# Patient Record
Sex: Male | Born: 1970 | Race: White | Hispanic: No | Marital: Single | Smoking: Current every day smoker
Health system: Southern US, Community
[De-identification: ages and names within clinical notes are randomized; demographics above are authoritative.]

## PROBLEM LIST (undated history)

## (undated) DIAGNOSIS — F17200 Nicotine dependence, unspecified, uncomplicated: Secondary | ICD-10-CM

## (undated) DIAGNOSIS — F1010 Alcohol abuse, uncomplicated: Secondary | ICD-10-CM

## (undated) DIAGNOSIS — Z789 Other specified health status: Secondary | ICD-10-CM

## (undated) DIAGNOSIS — I10 Essential (primary) hypertension: Secondary | ICD-10-CM

## (undated) HISTORY — PX: FRACTURE SURGERY: SHX138

---

## 2001-08-27 ENCOUNTER — Emergency Department (HOSPITAL_COMMUNITY): Admission: EM | Admit: 2001-08-27 | Discharge: 2001-08-27 | Payer: Self-pay | Admitting: Emergency Medicine

## 2001-08-27 ENCOUNTER — Encounter: Payer: Self-pay | Admitting: Emergency Medicine

## 2001-12-01 ENCOUNTER — Emergency Department (HOSPITAL_COMMUNITY): Admission: EM | Admit: 2001-12-01 | Discharge: 2001-12-01 | Payer: Self-pay | Admitting: Emergency Medicine

## 2003-08-06 ENCOUNTER — Emergency Department (HOSPITAL_COMMUNITY): Admission: AD | Admit: 2003-08-06 | Discharge: 2003-08-06 | Payer: Self-pay | Admitting: Emergency Medicine

## 2005-03-22 ENCOUNTER — Emergency Department (HOSPITAL_COMMUNITY): Admission: EM | Admit: 2005-03-22 | Discharge: 2005-03-22 | Payer: Self-pay | Admitting: Emergency Medicine

## 2005-03-28 ENCOUNTER — Ambulatory Visit (HOSPITAL_COMMUNITY): Admission: RE | Admit: 2005-03-28 | Discharge: 2005-03-29 | Payer: Self-pay | Admitting: Orthopedic Surgery

## 2006-11-19 ENCOUNTER — Emergency Department (HOSPITAL_COMMUNITY): Admission: EM | Admit: 2006-11-19 | Discharge: 2006-11-20 | Payer: Self-pay | Admitting: Emergency Medicine

## 2006-11-21 ENCOUNTER — Ambulatory Visit (HOSPITAL_COMMUNITY): Admission: RE | Admit: 2006-11-21 | Discharge: 2006-11-21 | Payer: Self-pay | Admitting: Orthopaedic Surgery

## 2007-02-06 ENCOUNTER — Emergency Department (HOSPITAL_COMMUNITY): Admission: EM | Admit: 2007-02-06 | Discharge: 2007-02-06 | Payer: Self-pay | Admitting: Emergency Medicine

## 2013-03-29 ENCOUNTER — Emergency Department (HOSPITAL_COMMUNITY): Payer: Self-pay

## 2013-03-29 ENCOUNTER — Emergency Department (HOSPITAL_COMMUNITY)
Admission: EM | Admit: 2013-03-29 | Discharge: 2013-03-29 | Disposition: A | Discharge status: Home / Self Care | Payer: Self-pay | Attending: Emergency Medicine | Admitting: Emergency Medicine

## 2013-03-29 ENCOUNTER — Encounter (HOSPITAL_COMMUNITY): Payer: Self-pay | Admitting: Emergency Medicine

## 2013-03-29 DIAGNOSIS — Y9389 Activity, other specified: Secondary | ICD-10-CM | POA: Insufficient documentation

## 2013-03-29 DIAGNOSIS — N6009 Solitary cyst of unspecified breast: Secondary | ICD-10-CM | POA: Insufficient documentation

## 2013-03-29 DIAGNOSIS — L539 Erythematous condition, unspecified: Secondary | ICD-10-CM | POA: Insufficient documentation

## 2013-03-29 DIAGNOSIS — N6001 Solitary cyst of right breast: Secondary | ICD-10-CM

## 2013-03-29 DIAGNOSIS — F17200 Nicotine dependence, unspecified, uncomplicated: Secondary | ICD-10-CM | POA: Insufficient documentation

## 2013-03-29 DIAGNOSIS — M7021 Olecranon bursitis, right elbow: Secondary | ICD-10-CM

## 2013-03-29 DIAGNOSIS — M7020 Olecranon bursitis, unspecified elbow: Secondary | ICD-10-CM | POA: Insufficient documentation

## 2013-03-29 DIAGNOSIS — R21 Rash and other nonspecific skin eruption: Secondary | ICD-10-CM | POA: Insufficient documentation

## 2013-03-29 MED ORDER — TRAMADOL HCL 50 MG PO TABS
50.0000 mg | ORAL_TABLET | Freq: Once | ORAL | Status: AC
  Administered 2013-03-29: 50 mg via ORAL
  Filled 2013-03-29: qty 1

## 2013-03-29 MED ORDER — IBUPROFEN 800 MG PO TABS: 800.0000 mg | ORAL_TABLET | Freq: Three times a day (TID) | ORAL | Status: DC

## 2013-03-29 MED ORDER — SULFAMETHOXAZOLE-TRIMETHOPRIM 800-160 MG PO TABS: 1.0000 | ORAL_TABLET | Freq: Two times a day (BID) | ORAL | Status: DC
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
## 2013-03-31 NOTE — ED Provider Notes (Signed)
Medical screening examination/treatment/procedure(s) were performed by non-physician practitioner and as supervising physician I was immediately available for consultation/collaboration.  Jaymari Cromie R. Lovella Hardie, MD 03/31/13 0016 

## 2013-04-07 ENCOUNTER — Ambulatory Visit (INDEPENDENT_AMBULATORY_CARE_PROVIDER_SITE_OTHER): Payer: Self-pay | Admitting: General Surgery

## 2013-04-15 ENCOUNTER — Encounter (INDEPENDENT_AMBULATORY_CARE_PROVIDER_SITE_OTHER): Payer: Self-pay | Admitting: General Surgery

## 2013-04-16 ENCOUNTER — Emergency Department (HOSPITAL_COMMUNITY)
Admission: EM | Admit: 2013-04-16 | Discharge: 2013-04-16 | Disposition: A | Discharge status: Home / Self Care | Payer: Self-pay | Attending: Emergency Medicine | Admitting: Emergency Medicine

## 2013-04-16 ENCOUNTER — Encounter (HOSPITAL_COMMUNITY): Payer: Self-pay | Admitting: Emergency Medicine

## 2013-04-16 ENCOUNTER — Emergency Department (HOSPITAL_COMMUNITY): Payer: Self-pay

## 2013-04-16 DIAGNOSIS — IMO0002 Reserved for concepts with insufficient information to code with codable children: Secondary | ICD-10-CM | POA: Insufficient documentation

## 2013-04-16 DIAGNOSIS — Y9389 Activity, other specified: Secondary | ICD-10-CM | POA: Insufficient documentation

## 2013-04-16 DIAGNOSIS — T07XXXA Unspecified multiple injuries, initial encounter: Secondary | ICD-10-CM

## 2013-04-16 DIAGNOSIS — S42023A Displaced fracture of shaft of unspecified clavicle, initial encounter for closed fracture: Secondary | ICD-10-CM | POA: Insufficient documentation

## 2013-04-16 DIAGNOSIS — F17200 Nicotine dependence, unspecified, uncomplicated: Secondary | ICD-10-CM | POA: Insufficient documentation

## 2013-04-16 DIAGNOSIS — Y92410 Unspecified street and highway as the place of occurrence of the external cause: Secondary | ICD-10-CM | POA: Insufficient documentation

## 2013-04-16 DIAGNOSIS — S42001A Fracture of unspecified part of right clavicle, initial encounter for closed fracture: Secondary | ICD-10-CM

## 2013-04-16 MED ORDER — KETOROLAC TROMETHAMINE 30 MG/ML IJ SOLN
30.0000 mg | Freq: Once | INTRAMUSCULAR | Status: AC
  Administered 2013-04-16: 30 mg via INTRAVENOUS
  Filled 2013-04-16: qty 1

## 2013-04-16 MED ORDER — HYDROMORPHONE HCL PF 2 MG/ML IJ SOLN
2.0000 mg | Freq: Once | INTRAMUSCULAR | Status: AC
  Administered 2013-04-16: 2 mg via INTRAVENOUS
  Filled 2013-04-16: qty 1

## 2013-04-16 MED ORDER — HYDROMORPHONE HCL PF 1 MG/ML IJ SOLN
1.0000 mg | Freq: Once | INTRAMUSCULAR | Status: AC
  Administered 2013-04-16: 1 mg via INTRAVENOUS
  Filled 2013-04-16: qty 1

## 2013-04-16 MED ORDER — OXYCODONE-ACETAMINOPHEN 5-325 MG PO TABS: 1.0000 | ORAL_TABLET | ORAL | Status: DC | PRN
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
## 2013-04-16 NOTE — ED Notes (Signed)
Placed bacitracin on wounds and cover with gauze.

## 2013-04-16 NOTE — ED Notes (Signed)
Pt returned from radiology.
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
## 2013-04-16 NOTE — ED Notes (Signed)
Paged ortho 
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
## 2013-04-28 ENCOUNTER — Encounter (HOSPITAL_COMMUNITY): Payer: Self-pay | Admitting: *Deleted

## 2013-04-28 ENCOUNTER — Encounter (HOSPITAL_COMMUNITY): Payer: Self-pay | Admitting: Pharmacy Technician

## 2013-04-28 NOTE — Interval H&P Note (Signed)
History and Physical Interval Note:  04/28/2013 10:50 PM  Timothy Estrada  has presents for surgery, with the diagnosis of RIGHT CLAVICLE FRACTURE  The various methods of treatment have been discussed with the patient and family. After consideration of risks, benefits and other options for treatment, the patient has consented to  Procedure(s): OPEN REDUCTION INTERNAL FIXATION (ORIF) CLAVICULAR FRACTURE (Right) as a surgical intervention .  The patient's history has been reviewed, patient examined, no change in status, stable for surgery.  I have reviewed the patient's chart and labs.  Questions were answered to the patient's satisfaction.    No changes from previous consult  Pt with profound shortening of R clavicle. Pt want to proceed with ORIF of R clavicle  Skin over R clavicle is stable, mobile + deformity    Mearl Latin, PA-C [facility] [phone] (P) 04/28/2013 10:52 PM
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
## 2013-04-29 ENCOUNTER — Ambulatory Visit (HOSPITAL_COMMUNITY): Payer: Self-pay

## 2013-04-29 ENCOUNTER — Encounter (HOSPITAL_COMMUNITY)
Admission: RE | Disposition: A | Discharge status: Home / Self Care | Payer: Self-pay | Source: Ambulatory Visit | Attending: Orthopedic Surgery

## 2013-04-29 ENCOUNTER — Encounter (HOSPITAL_COMMUNITY): Payer: Self-pay | Admitting: *Deleted

## 2013-04-29 ENCOUNTER — Ambulatory Visit (HOSPITAL_COMMUNITY): Payer: Self-pay | Admitting: Anesthesiology

## 2013-04-29 ENCOUNTER — Encounter (HOSPITAL_COMMUNITY): Payer: Self-pay | Admitting: Anesthesiology

## 2013-04-29 ENCOUNTER — Ambulatory Visit (HOSPITAL_COMMUNITY)
Admission: RE | Admit: 2013-04-29 | Discharge: 2013-04-29 | Disposition: A | Discharge status: Home / Self Care | Payer: MEDICAID | Source: Ambulatory Visit | Attending: Orthopedic Surgery | Admitting: Orthopedic Surgery

## 2013-04-29 DIAGNOSIS — L723 Sebaceous cyst: Secondary | ICD-10-CM | POA: Insufficient documentation

## 2013-04-29 DIAGNOSIS — Y92410 Unspecified street and highway as the place of occurrence of the external cause: Secondary | ICD-10-CM | POA: Insufficient documentation

## 2013-04-29 DIAGNOSIS — F17200 Nicotine dependence, unspecified, uncomplicated: Secondary | ICD-10-CM | POA: Insufficient documentation

## 2013-04-29 DIAGNOSIS — S42001A Fracture of unspecified part of right clavicle, initial encounter for closed fracture: Secondary | ICD-10-CM

## 2013-04-29 DIAGNOSIS — S42023A Displaced fracture of shaft of unspecified clavicle, initial encounter for closed fracture: Secondary | ICD-10-CM | POA: Insufficient documentation

## 2013-04-29 HISTORY — PX: ORIF CLAVICULAR FRACTURE: SHX5055

## 2013-04-29 HISTORY — DX: Other specified health status: Z78.9

## 2013-04-29 LAB — SURGICAL PCR SCREEN
MRSA, PCR: NEGATIVE
Staphylococcus aureus: NEGATIVE

## 2013-04-29 LAB — CBC
Hemoglobin: 13.9 g/dL (ref 13.0–17.0)
MCHC: 33.9 g/dL (ref 30.0–36.0)
Platelets: 257 10*3/uL (ref 150–400)

## 2013-04-29 LAB — GRAM STAIN

## 2013-04-29 SURGERY — OPEN REDUCTION INTERNAL FIXATION (ORIF) CLAVICULAR FRACTURE
Anesthesia: General | Laterality: Right | Wound class: Clean

## 2013-04-29 MED ORDER — OXYCODONE HCL 5 MG PO TABS: 5.0000 mg | ORAL_TABLET | ORAL | Status: DC | PRN

## 2013-04-29 MED ORDER — METHOCARBAMOL 500 MG PO TABS: 500.0000 mg | ORAL_TABLET | Freq: Four times a day (QID) | ORAL | Status: DC | PRN

## 2013-04-29 MED ORDER — MIDAZOLAM HCL 5 MG/5ML IJ SOLN
INTRAMUSCULAR | Status: DC | PRN
  Administered 2013-04-29: 2 mg via INTRAVENOUS

## 2013-04-29 MED ORDER — OXYCODONE HCL 5 MG PO TABS: 5.0000 mg | ORAL_TABLET | Freq: Once | ORAL | Status: DC | PRN

## 2013-04-29 MED ORDER — ROCURONIUM BROMIDE 100 MG/10ML IV SOLN
INTRAVENOUS | Status: DC | PRN
  Administered 2013-04-29: 50 mg via INTRAVENOUS
  Administered 2013-04-29: 20 mg via INTRAVENOUS

## 2013-04-29 MED ORDER — NEOSTIGMINE METHYLSULFATE 1 MG/ML IJ SOLN
INTRAMUSCULAR | Status: DC | PRN
  Administered 2013-04-29: 3 mg via INTRAVENOUS

## 2013-04-29 MED ORDER — HYDRALAZINE HCL 20 MG/ML IJ SOLN
INTRAMUSCULAR | Status: DC | PRN
  Administered 2013-04-29: 5 mg via INTRAVENOUS

## 2013-04-29 MED ORDER — MUPIROCIN 2 % EX OINT
TOPICAL_OINTMENT | CUTANEOUS | Status: AC
  Filled 2013-04-29: qty 22

## 2013-04-29 MED ORDER — OXYCODONE HCL 5 MG PO TABS
10.0000 mg | ORAL_TABLET | Freq: Once | ORAL | Status: AC
  Administered 2013-04-29: 10 mg via ORAL

## 2013-04-29 MED ORDER — HYDROMORPHONE HCL PF 1 MG/ML IJ SOLN
0.2500 mg | INTRAMUSCULAR | Status: DC | PRN
  Administered 2013-04-29 (×6): 0.5 mg via INTRAVENOUS

## 2013-04-29 MED ORDER — OXYCODONE-ACETAMINOPHEN 5-325 MG PO TABS
2.0000 | ORAL_TABLET | Freq: Once | ORAL | Status: AC
  Administered 2013-04-29: 2 via ORAL

## 2013-04-29 MED ORDER — HYDROMORPHONE HCL PF 1 MG/ML IJ SOLN
INTRAMUSCULAR | Status: AC
  Filled 2013-04-29: qty 2

## 2013-04-29 MED ORDER — CEFAZOLIN SODIUM-DEXTROSE 2-3 GM-% IV SOLR
INTRAVENOUS | Status: AC
  Administered 2013-04-29: 2 g via INTRAVENOUS
  Filled 2013-04-29: qty 50

## 2013-04-29 MED ORDER — OXYCODONE-ACETAMINOPHEN 5-325 MG PO TABS
ORAL_TABLET | ORAL | Status: AC
  Filled 2013-04-29: qty 2

## 2013-04-29 MED ORDER — OXYCODONE HCL 5 MG/5ML PO SOLN: 5.0000 mg | Freq: Once | ORAL | Status: DC | PRN

## 2013-04-29 MED ORDER — BUPIVACAINE-EPINEPHRINE PF 0.5-1:200000 % IJ SOLN
INTRAMUSCULAR | Status: AC
  Filled 2013-04-29: qty 30

## 2013-04-29 MED ORDER — GLYCOPYRROLATE 0.2 MG/ML IJ SOLN
INTRAMUSCULAR | Status: DC | PRN
  Administered 2013-04-29: 0.4 mg via INTRAVENOUS

## 2013-04-29 MED ORDER — CEFAZOLIN SODIUM-DEXTROSE 2-3 GM-% IV SOLR: 2.0000 g | INTRAVENOUS | Status: DC

## 2013-04-29 MED ORDER — OXYCODONE HCL 5 MG PO TABS
ORAL_TABLET | ORAL | Status: AC
  Filled 2013-04-29: qty 2

## 2013-04-29 MED ORDER — LACTATED RINGERS IV SOLN: INTRAVENOUS | Status: DC

## 2013-04-29 MED ORDER — LABETALOL HCL 5 MG/ML IV SOLN
INTRAVENOUS | Status: DC | PRN
  Administered 2013-04-29 (×3): 5 mg via INTRAVENOUS

## 2013-04-29 MED ORDER — BUPIVACAINE-EPINEPHRINE PF 0.5-1:200000 % IJ SOLN
INTRAMUSCULAR | Status: DC | PRN
  Administered 2013-04-29: 30 mL

## 2013-04-29 MED ORDER — HYDROMORPHONE HCL PF 1 MG/ML IJ SOLN
INTRAMUSCULAR | Status: AC
  Filled 2013-04-29: qty 1

## 2013-04-29 MED ORDER — LIDOCAINE HCL (CARDIAC) 20 MG/ML IV SOLN
INTRAVENOUS | Status: DC | PRN
  Administered 2013-04-29: 70 mg via INTRAVENOUS

## 2013-04-29 MED ORDER — ONDANSETRON HCL 4 MG/2ML IJ SOLN: 4.0000 mg | Freq: Four times a day (QID) | INTRAMUSCULAR | Status: DC | PRN

## 2013-04-29 MED ORDER — 0.9 % SODIUM CHLORIDE (POUR BTL) OPTIME
TOPICAL | Status: DC | PRN
  Administered 2013-04-29: 1000 mL

## 2013-04-29 MED ORDER — HYDROMORPHONE HCL PF 1 MG/ML IJ SOLN
INTRAMUSCULAR | Status: DC | PRN
  Administered 2013-04-29: 1 mg via INTRAVENOUS

## 2013-04-29 MED ORDER — MUPIROCIN 2 % EX OINT
TOPICAL_OINTMENT | Freq: Once | CUTANEOUS | Status: AC
  Administered 2013-04-29: 07:00:00 via NASAL
  Filled 2013-04-29: qty 22

## 2013-04-29 MED ORDER — METHOCARBAMOL 500 MG PO TABS
ORAL_TABLET | ORAL | Status: AC
  Filled 2013-04-29: qty 2

## 2013-04-29 MED ORDER — OXYCODONE-ACETAMINOPHEN 5-325 MG PO TABS: 1.0000 | ORAL_TABLET | Freq: Four times a day (QID) | ORAL | Status: DC | PRN

## 2013-04-29 MED ORDER — AMOXICILLIN-POT CLAVULANATE 875-125 MG PO TABS: 1.0000 | ORAL_TABLET | Freq: Two times a day (BID) | ORAL | Status: DC

## 2013-04-29 MED ORDER — ONDANSETRON HCL 4 MG/2ML IJ SOLN
INTRAMUSCULAR | Status: DC | PRN
  Administered 2013-04-29: 4 mg via INTRAVENOUS

## 2013-04-29 MED ORDER — LACTATED RINGERS IV SOLN
INTRAVENOUS | Status: DC | PRN
  Administered 2013-04-29 (×2): via INTRAVENOUS

## 2013-04-29 MED ORDER — METHOCARBAMOL 500 MG PO TABS
1000.0000 mg | ORAL_TABLET | Freq: Once | ORAL | Status: AC
  Administered 2013-04-29: 1000 mg via ORAL

## 2013-04-29 MED ORDER — PROPOFOL 10 MG/ML IV BOLUS
INTRAVENOUS | Status: DC | PRN
  Administered 2013-04-29 (×2): 200 mg via INTRAVENOUS

## 2013-04-29 MED ORDER — ONDANSETRON HCL 4 MG PO TABS: 4.0000 mg | ORAL_TABLET | Freq: Three times a day (TID) | ORAL | Status: DC | PRN

## 2013-04-29 MED ORDER — FENTANYL CITRATE 0.05 MG/ML IJ SOLN
INTRAMUSCULAR | Status: DC | PRN
  Administered 2013-04-29 (×3): 100 ug via INTRAVENOUS
  Administered 2013-04-29: 50 ug via INTRAVENOUS
  Administered 2013-04-29 (×2): 100 ug via INTRAVENOUS
  Administered 2013-04-29: 150 ug via INTRAVENOUS
  Administered 2013-04-29: 50 ug via INTRAVENOUS

## 2013-04-29 SURGICAL SUPPLY — 52 items
BRUSH SCRUB DISP (MISCELLANEOUS) ×4 IMPLANT
CLOTH BEACON ORANGE TIMEOUT ST (SAFETY) ×2 IMPLANT
COVER SURGICAL LIGHT HANDLE (MISCELLANEOUS) ×4 IMPLANT
DRAPE C-ARM 42X72 X-RAY (DRAPES) ×2 IMPLANT
DRAPE C-ARMOR (DRAPES) IMPLANT
DRAPE INCISE IOBAN 66X45 STRL (DRAPES) IMPLANT
DRAPE LAPAROTOMY TRNSV 102X78 (DRAPE) ×2 IMPLANT
DRAPE ORTHO SPLIT 77X108 STRL (DRAPES) ×1
DRAPE SURG ORHT 6 SPLT 77X108 (DRAPES) ×1 IMPLANT
DRAPE U-SHAPE 47X51 STRL (DRAPES) ×2 IMPLANT
DRSG EMULSION OIL 3X3 NADH (GAUZE/BANDAGES/DRESSINGS) ×2 IMPLANT
DRSG MEPILEX BORDER 4X12 (GAUZE/BANDAGES/DRESSINGS) ×2 IMPLANT
ELECT NEEDLE TIP 2.8 STRL (NEEDLE) IMPLANT
ELECT REM PT RETURN 9FT ADLT (ELECTROSURGICAL) ×2
ELECTRODE REM PT RTRN 9FT ADLT (ELECTROSURGICAL) ×1 IMPLANT
GLOVE BIO SURGEON STRL SZ7.5 (GLOVE) ×2 IMPLANT
GLOVE BIO SURGEON STRL SZ8 (GLOVE) ×4 IMPLANT
GLOVE BIOGEL PI IND STRL 7.5 (GLOVE) ×1 IMPLANT
GLOVE BIOGEL PI IND STRL 8 (GLOVE) ×1 IMPLANT
GLOVE BIOGEL PI INDICATOR 7.5 (GLOVE) ×1
GLOVE BIOGEL PI INDICATOR 8 (GLOVE) ×1
GOWN PREVENTION PLUS XLARGE (GOWN DISPOSABLE) ×2 IMPLANT
GOWN STRL NON-REIN LRG LVL3 (GOWN DISPOSABLE) ×4 IMPLANT
KIT BASIN OR (CUSTOM PROCEDURE TRAY) ×2 IMPLANT
KIT ROOM TURNOVER OR (KITS) ×2 IMPLANT
MANIFOLD NEPTUNE II (INSTRUMENTS) IMPLANT
NEEDLE HYPO 25GX1X1/2 BEV (NEEDLE) ×2 IMPLANT
NS IRRIG 1000ML POUR BTL (IV SOLUTION) ×2 IMPLANT
PACK TOTAL JOINT (CUSTOM PROCEDURE TRAY) ×2 IMPLANT
PAD ARMBOARD 7.5X6 YLW CONV (MISCELLANEOUS) ×4 IMPLANT
PLATE SUP MDST CLAV R 8H 100MM (Plate) ×2 IMPLANT
SCREW NONLOCK 3.5X12MM TI LNG (Screw) ×4 IMPLANT
SCREW NONLOCK 3.5X14MM TI LNG (Screw) ×4 IMPLANT
SCREW NONLOCK 3.5X16MM TI LNG (Screw) ×2 IMPLANT
SLING ARM FOAM STRAP LRG (SOFTGOODS) ×2 IMPLANT
SPONGE GAUZE 4X4 12PLY (GAUZE/BANDAGES/DRESSINGS) ×2 IMPLANT
SPONGE LAP 18X18 X RAY DECT (DISPOSABLE) IMPLANT
STRIP CLOSURE SKIN 1/2X4 (GAUZE/BANDAGES/DRESSINGS) ×2 IMPLANT
SUCTION FRAZIER TIP 10 FR DISP (SUCTIONS) ×2 IMPLANT
SUT ETHILON 3 0 PS 1 (SUTURE) ×2 IMPLANT
SUT MNCRL AB 4-0 PS2 18 (SUTURE) ×2 IMPLANT
SUT PROLENE 3 0 PS 1 (SUTURE) ×2 IMPLANT
SUT VIC AB 0 CT1 27 (SUTURE) ×1
SUT VIC AB 0 CT1 27XBRD ANBCTR (SUTURE) ×1 IMPLANT
SUT VIC AB 1 CT1 27 (SUTURE) ×1
SUT VIC AB 1 CT1 27XBRD ANBCTR (SUTURE) ×1 IMPLANT
SUT VIC AB 2-0 CT1 27 (SUTURE) ×1
SUT VIC AB 2-0 CT1 TAPERPNT 27 (SUTURE) ×1 IMPLANT
SUT VIC AB 2-0 CT3 27 (SUTURE) IMPLANT
SYR CONTROL 10ML LL (SYRINGE) IMPLANT
WATER STERILE IRR 1000ML POUR (IV SOLUTION) ×2 IMPLANT
YANKAUER SUCT BULB TIP NO VENT (SUCTIONS) ×2 IMPLANT

## 2013-04-29 NOTE — Anesthesia Preprocedure Evaluation (Addendum)
Anesthesia Evaluation  Patient identified by MRN, date of birth, ID band Patient awake    Reviewed: Allergy & Precautions, H&P , NPO status , Patient's Chart, lab work & pertinent test results  History of Anesthesia Complications Negative for: history of anesthetic complications  Airway Mallampati: II TM Distance: >3 FB Neck ROM: full    Dental  (+) Teeth Intact and Dental Advisory Given   Pulmonary Current Smoker,          Cardiovascular negative cardio ROS      Neuro/Psych negative neurological ROS  negative psych ROS   GI/Hepatic negative GI ROS, Neg liver ROS,   Endo/Other  negative endocrine ROS  Renal/GU negative Renal ROS  negative genitourinary   Musculoskeletal negative musculoskeletal ROS (+)   Abdominal   Peds  Hematology negative hematology ROS (+)   Anesthesia Other Findings   Reproductive/Obstetrics negative OB ROS                         Anesthesia Physical Anesthesia Plan  ASA: II  Anesthesia Plan: General   Post-op Pain Management:    Induction: Intravenous  Airway Management Planned: Oral ETT  Additional Equipment:   Intra-op Plan:   Post-operative Plan: Extubation in OR  Informed Consent: I have reviewed the patients History and Physical, chart, labs and discussed the procedure including the risks, benefits and alternatives for the proposed anesthesia with the patient or authorized representative who has indicated his/her understanding and acceptance.     Plan Discussed with: CRNA, Anesthesiologist and Surgeon  Anesthesia Plan Comments:         Anesthesia Quick Evaluation

## 2013-04-29 NOTE — Preoperative (Signed)
Beta Blockers   Reason not to administer Beta Blockers:Not Applicable 

## 2013-04-29 NOTE — Transfer of Care (Signed)
Immediate Anesthesia Transfer of Care Note  Patient: Timothy Estrada  Procedure(s) Performed: Procedure(s) with comments: OPEN REDUCTION INTERNAL FIXATION (ORIF) CLAVICULAR FRACTURE (Right) - Aspiration of right breast mass,? sebaceaous cyst  Patient Location: PACU  Anesthesia Type:General  Level of Consciousness: awake, alert  and oriented  Airway & Oxygen Therapy: Patient Spontanous Breathing and Patient connected to nasal cannula oxygen  Post-op Assessment: Report given to PACU RN and Post -op Vital signs reviewed and stable  Post vital signs: Reviewed and stable  Complications: No apparent anesthesia complications

## 2013-04-29 NOTE — Anesthesia Postprocedure Evaluation (Signed)
Anesthesia Post Note  Patient: Timothy Estrada  Procedure(s) Performed: Procedure(s) (LRB): OPEN REDUCTION INTERNAL FIXATION (ORIF) CLAVICULAR FRACTURE (Right)  Anesthesia type: General  Patient location: PACU  Post pain: Pain level controlled and Adequate analgesia  Post assessment: Post-op Vital signs reviewed, Patient's Cardiovascular Status Stable, Respiratory Function Stable, Patent Airway and Pain level controlled  Last Vitals:  Filed Vitals:   04/29/13 1100  BP:   Pulse:   Temp:   Resp: 14    Post vital signs: Reviewed and stable  Level of consciousness: awake, alert  and oriented  Complications: No apparent anesthesia complications

## 2013-04-29 NOTE — Brief Op Note (Signed)
04/29/2013  11:14 AM  PATIENT:  Timothy Estrada  42 y.o. male  PRE-OPERATIVE DIAGNOSIS:  RIGHT CLAVICLE FRACTURE  POST-OPERATIVE DIAGNOSIS:  RIGHT CLAVICLE FRACTURE  PROCEDURE:  Procedure(s) with comments: OPEN REDUCTION INTERNAL FIXATION (ORIF) CLAVICULAR FRACTURE (Right) - Aspiration of right breast mass,? sebaceaous cyst  SURGEON:  Surgeon(s) and Role:    * Budd Palmer, MD - Primary  ANESTHESIA:   general  EBL:  Total I/O In: 1300 [I.V.:1300] Out: 100 [Blood:100]  BLOOD ADMINISTERED:none  DRAINS: none   LOCAL MEDICATIONS USED:  MARCAINE     SPECIMEN:  Source of Specimen:  sebaceous cyst right breast  DISPOSITION OF SPECIMEN:  micro; gram positive cocci  COUNTS:  YES  TOURNIQUET:  * No tourniquets in log *  DICTATION: .Other Dictation: Dictation Number [id number]  PLAN OF CARE: Discharge to home after PACU  PATIENT DISPOSITION:  PACU - hemodynamically stable.   Delay start of Pharmacological VTE agent (>24hrs) due to surgical blood loss or risk of bleeding: no

## 2013-04-29 NOTE — OR Nursing (Signed)
May give additional dilaudid per anesthesia

## 2013-04-29 NOTE — Progress Notes (Signed)
I have seen and examined the patient. I agree with the findings above and Mr. Ollen Gross note from 04/28/13. Right clavicle fracture, severely displaced for ORIF.  I discussed with the patient the risks and benefits of surgery, including the possibility of infection, nerve injury, vessel injury, wound breakdown, arthritis, symptomatic hardware, DVT/ PE, loss of motion, and need for further surgery among others.  He understood these risks and wished to proceed.   Budd Palmer, MD 04/29/2013 7:50 AM

## 2013-04-30 ENCOUNTER — Encounter (HOSPITAL_COMMUNITY): Payer: Self-pay | Admitting: Orthopedic Surgery
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
## 2013-05-03 LAB — CULTURE, ROUTINE-ABSCESS

## 2013-05-04 LAB — ANAEROBIC CULTURE: Gram Stain: NONE SEEN

## 2013-06-02 ENCOUNTER — Ambulatory Visit (INDEPENDENT_AMBULATORY_CARE_PROVIDER_SITE_OTHER): Payer: Self-pay | Admitting: Surgery

## 2013-06-28 ENCOUNTER — Telehealth (INDEPENDENT_AMBULATORY_CARE_PROVIDER_SITE_OTHER): Payer: Self-pay | Admitting: Surgery

## 2013-06-28 ENCOUNTER — Encounter (INDEPENDENT_AMBULATORY_CARE_PROVIDER_SITE_OTHER): Payer: Self-pay | Admitting: Surgery

## 2013-06-28 ENCOUNTER — Ambulatory Visit (INDEPENDENT_AMBULATORY_CARE_PROVIDER_SITE_OTHER): Payer: Self-pay | Admitting: Surgery

## 2013-06-28 VITALS — BP 120/78 | HR 62 | Temp 98.0°F | Resp 14 | Ht 69.0 in | Wt 159.8 lb

## 2013-06-28 DIAGNOSIS — R222 Localized swelling, mass and lump, trunk: Secondary | ICD-10-CM

## 2013-06-28 NOTE — Telephone Encounter (Signed)
Patient met with surgery scheduling went over financial responsibilities, patient will call back to schedule. °

## 2013-06-28 NOTE — Progress Notes (Signed)
Patient ID: Timothy Estrada, male   DOB: March 12, 1971, 42 y.o.   MRN: [id number]  Chief Complaint  Patient presents with  . New Evaluation    eval chest cyst    HPI Timothy Estrada is a 42 y.o. male.   HPI This is a pleasant gentleman who I actually saw in consultation in the operating room when he was having a clavicle fracture repaired. He was found to have a large mass or cyst of the right chest wall/breast. He is now interested in having this removed. It occasionally drains. It is causing increased discomfort. He has never noticed erythema or drainage from the nipple. He is otherwise without complaints Past Medical History  Diagnosis Date  . Medical history non-contributory     Past Surgical History  Procedure Laterality Date  . Fracture surgery      Hx of right ankle  . Orif clavicular fracture Right 04/29/2013    Procedure: OPEN REDUCTION INTERNAL FIXATION (ORIF) CLAVICULAR FRACTURE;  Surgeon: Budd Palmer, MD;  Location: [facility];  Service: Orthopedics;  Laterality: Right;  Aspiration of right breast mass,? sebaceaous cyst    History reviewed. No pertinent family history.  Social History History  Substance Use Topics  . Smoking status: Current Every Day Smoker -- 0.50 packs/day  . Smokeless tobacco: Not on file  . Alcohol Use: Yes     Comment: occasional    No Known Allergies  No current outpatient prescriptions on file.   No current facility-administered medications for this visit.    Review of Systems Review of Systems  Constitutional: Negative for fever, chills and unexpected weight change.  HENT: Negative for hearing loss, congestion, sore throat, trouble swallowing and voice change.   Eyes: Negative for visual disturbance.  Respiratory: Negative for cough and wheezing.   Cardiovascular: Negative for chest pain, palpitations and leg swelling.  Gastrointestinal: Negative for nausea, vomiting, abdominal pain, diarrhea, constipation, blood in stool, abdominal  distention, anal bleeding and rectal pain.  Genitourinary: Negative for hematuria and difficulty urinating.  Musculoskeletal: Negative for arthralgias.  Skin: Negative for rash and wound.  Neurological: Negative for seizures, syncope, weakness and headaches.  Hematological: Negative for adenopathy. Does not bruise/bleed easily.  Psychiatric/Behavioral: Negative for confusion.    Blood pressure 120/78, pulse 62, temperature 98 F (36.7 C), temperature source Temporal, resp. rate 14, height 5\' 9"  (1.753 m), weight 159 lb 12.8 oz (72.485 kg).  Physical Exam Physical Exam  Constitutional: He appears well-developed and well-nourished. No distress.  HENT:  Head: Normocephalic and atraumatic.  Neck: Normal range of motion.  Cardiovascular: Normal rate, regular rhythm and normal heart sounds.   Pulmonary/Chest: Effort normal and breath sounds normal.  Neurological: He is alert.  Skin: Skin is warm and dry. No erythema.  There is a 6 cm soft mobile mass adjacent to the right breast/areola along the chest wall.  Psychiatric: His behavior is normal.    Data Reviewed   Assessment    Right chest wall/breast mass     Plan    Removal of this is recommended in the operating room as it is painful, has been draining, and to rule out malignancy. I discussed the risks with him in detail which includes but is not limited to bleeding, infection, and recurrence. He understands and wishes to proceed.        Kianni Lheureux A 06/28/2013, 2:38 PM

## 2014-09-08 ENCOUNTER — Emergency Department (HOSPITAL_COMMUNITY)
Admission: EM | Admit: 2014-09-08 | Discharge: 2014-09-08 | Disposition: A | Discharge status: Home / Self Care | Payer: No Typology Code available for payment source | Attending: Emergency Medicine | Admitting: Emergency Medicine

## 2014-09-08 ENCOUNTER — Encounter (HOSPITAL_COMMUNITY): Payer: Self-pay | Admitting: Emergency Medicine

## 2014-09-08 DIAGNOSIS — R6883 Chills (without fever): Secondary | ICD-10-CM | POA: Insufficient documentation

## 2014-09-08 DIAGNOSIS — J029 Acute pharyngitis, unspecified: Secondary | ICD-10-CM | POA: Insufficient documentation

## 2014-09-08 DIAGNOSIS — R51 Headache: Secondary | ICD-10-CM | POA: Insufficient documentation

## 2014-09-08 DIAGNOSIS — K029 Dental caries, unspecified: Secondary | ICD-10-CM | POA: Insufficient documentation

## 2014-09-08 DIAGNOSIS — Z720 Tobacco use: Secondary | ICD-10-CM | POA: Insufficient documentation

## 2014-09-08 MED ORDER — HYDROCODONE-ACETAMINOPHEN 5-325 MG PO TABS: 1.0000 | ORAL_TABLET | Freq: Four times a day (QID) | ORAL | Status: DC | PRN

## 2014-09-08 MED ORDER — PENICILLIN V POTASSIUM 500 MG PO TABS: 500.0000 mg | ORAL_TABLET | Freq: Three times a day (TID) | ORAL | Status: DC

## 2014-09-08 NOTE — ED Provider Notes (Signed)
Medical screening examination/treatment/procedure(s) were performed by non-physician practitioner and as supervising physician I was immediately available for consultation/collaboration.   EKG Interpretation None       Ephraim Hamburger, MD 09/08/14 1606

## 2014-09-08 NOTE — ED Notes (Signed)
Pt reports a 1 week Hx of dental pain.

## 2014-09-08 NOTE — Discharge Instructions (Signed)

## 2014-09-08 NOTE — ED Provider Notes (Signed)
CSN: [id number]     Arrival date & time 09/08/14  1520 History  This chart was scribed for non-physician practitioner Domenic Moras, working with Ephraim Hamburger, MD by Donato Schultz, ED Scribe. This patient was seen in room TR09C/TR09C and the patient's care was started at 3:55 PM.    Chief Complaint  Patient presents with  . Dental Pain    Patient is a 43 y.o. male presenting with tooth pain. The history is provided by the patient. No language interpreter was used.  Dental Pain Associated symptoms: headaches   Associated symptoms: no congestion and no fever    HPI Comments: Timothy Estrada is a 43 y.o. male who presents to the Emergency Department complaining of constant, throbbing, left lower-sided dental pain that started 4-5 days ago.  He states that the pain started as a headache and later migrated to his mouth.  Chewing aggravates his pain.  He has taken 2 Ibuprofen daily since his symptoms started and salt gargles with no relief to his symptoms.  He endorses hot and cold chills and left sided sore throat as associated symptoms.  He denies nasal congestion, tinnitus, trouble swallowing, fever, and cough as associated symptoms.  He does not have a dentist.  The patient is a smoker.     Past Medical History  Diagnosis Date  . Medical history non-contributory    Past Surgical History  Procedure Laterality Date  . Fracture surgery      Hx of right ankle  . Orif clavicular fracture Right 04/29/2013    Procedure: OPEN REDUCTION INTERNAL FIXATION (ORIF) CLAVICULAR FRACTURE;  Surgeon: Rozanna Box, MD;  Location: [place];  Service: Orthopedics;  Laterality: Right;  Aspiration of right breast mass,? sebaceaous cyst   History reviewed. No pertinent family history. History  Substance Use Topics  . Smoking status: Current Every Day Smoker -- 0.50 packs/day  . Smokeless tobacco: Not on file  . Alcohol Use: Yes     Comment: occasional    Review of Systems  Constitutional: Positive for  chills. Negative for fever.  HENT: Positive for dental problem and sore throat. Negative for congestion, tinnitus and trouble swallowing.   Respiratory: Negative for cough.   Neurological: Positive for headaches.  All other systems reviewed and are negative.     Allergies  Review of patient's allergies indicates no known allergies.  Home Medications   Prior to Admission medications   Not on File   BP 152/101  Pulse 78  Temp(Src) 97.9 F (36.6 C) (Oral)  Resp 14  Ht 5\' 9"  (1.753 m)  Wt 152 lb (68.947 kg)  BMI 22.44 kg/m2  SpO2 97% Physical Exam  Nursing note and vitals reviewed. Constitutional: He is oriented to person, place, and time. He appears well-developed and well-nourished.  HENT:  Head: Normocephalic and atraumatic.  Mouth/Throat: No trismus in the jaw.  Tooth number 18 is tender to palpation.  Loosened with manipulation.  Mild decay at the base of tooth.  No significant abscess.   Eyes: EOM are normal.  Neck: Normal range of motion.  Cardiovascular: Normal rate.   Pulmonary/Chest: Effort normal.  Musculoskeletal: Normal range of motion.  Lymphadenopathy:    He has cervical adenopathy (anterior).  Neurological: He is alert and oriented to person, place, and time.  Skin: Skin is warm and dry.  Psychiatric: He has a normal mood and affect. His behavior is normal.    ED Course  Procedures (including critical care time)  DIAGNOSTIC STUDIES: Oxygen  Saturation is 97% on room air, adequate by my interpretation.    COORDINATION OF CARE: 3:57 PM- Discussed discharging the patient with antibiotics, pain medication, and a referral to a dentist.  Advised the patient to visit the free dental clinic next week.  The patient agreed to the treatment plan.   Labs Review Labs Reviewed - No data to display  Imaging Review No results found.   EKG Interpretation None      MDM   Final diagnoses:  Pain due to dental caries    BP 152/101  Pulse 78  Temp(Src)  97.9 F (36.6 C) (Oral)  Resp 14  Ht 5\' 9"  (1.753 m)  Wt 152 lb (68.947 kg)  BMI 22.44 kg/m2  SpO2 97%   I personally performed the services described in this documentation, which was scribed in my presence. The recorded information has been reviewed and is accurate.     Domenic Moras, PA-C 09/08/14 [phone]

## 2015-06-16 ENCOUNTER — Emergency Department (HOSPITAL_COMMUNITY)
Admission: EM | Admit: 2015-06-16 | Discharge: 2015-06-16 | Discharge status: Against Medical Advice | Payer: Self-pay | Attending: Emergency Medicine | Admitting: Emergency Medicine

## 2015-06-16 ENCOUNTER — Encounter (HOSPITAL_COMMUNITY): Payer: Self-pay | Admitting: Emergency Medicine

## 2015-06-16 DIAGNOSIS — Z720 Tobacco use: Secondary | ICD-10-CM | POA: Insufficient documentation

## 2015-06-16 DIAGNOSIS — K088 Other specified disorders of teeth and supporting structures: Secondary | ICD-10-CM | POA: Insufficient documentation

## 2015-06-16 DIAGNOSIS — Q753 Macrocephaly: Secondary | ICD-10-CM | POA: Insufficient documentation

## 2015-06-16 DIAGNOSIS — K0889 Other specified disorders of teeth and supporting structures: Secondary | ICD-10-CM

## 2015-06-16 DIAGNOSIS — K029 Dental caries, unspecified: Secondary | ICD-10-CM | POA: Insufficient documentation

## 2015-06-16 DIAGNOSIS — R51 Headache: Secondary | ICD-10-CM | POA: Insufficient documentation

## 2015-06-16 MED ORDER — BUPIVACAINE-EPINEPHRINE (PF) 0.25% -1:200000 IJ SOLN
1.8000 mL | Freq: Once | INTRAMUSCULAR | Status: DC
  Filled 2015-06-16: qty 10

## 2015-06-16 NOTE — ED Notes (Signed)
Pt not in room.  PA notified.

## 2015-06-16 NOTE — ED Notes (Signed)
Dental box present at the bedside.

## 2015-06-16 NOTE — ED Provider Notes (Signed)
CSN: [id number]     Arrival date & time 06/16/15  0609 History   First MD Initiated Contact with Patient 06/16/15 [id number]     Chief Complaint  Patient presents with  . Dental Pain     (Consider location/radiation/quality/duration/timing/severity/associated sxs/prior Treatment) The history is provided by the patient.   Pt p/w right upper dental pain that has been ongoing for 3-4 days.  Pain is constant, throbbing, worse with eating.  Associated headache.  Has taken ibuprofen without improvement.  Has had problem with this tooth before, resolved with medications for ED, never went to the dentist.  Denies fevers, facial swelling, sore throat, difficulty swallowing or breathing.    Past Medical History  Diagnosis Date  . Medical history non-contributory    Past Surgical History  Procedure Laterality Date  . Fracture surgery      Hx of right ankle  . Orif clavicular fracture Right 04/29/2013    Procedure: OPEN REDUCTION INTERNAL FIXATION (ORIF) CLAVICULAR FRACTURE;  Surgeon: Rozanna Box, MD;  Location: [place];  Service: Orthopedics;  Laterality: Right;  Aspiration of right breast mass,? sebaceaous cyst   History reviewed. No pertinent family history. History  Substance Use Topics  . Smoking status: Current Every Day Smoker -- 0.50 packs/day  . Smokeless tobacco: Not on file  . Alcohol Use: Yes     Comment: occasional    Review of Systems  Constitutional: Negative for fever and chills.  HENT: Positive for dental problem. Negative for facial swelling, sore throat and trouble swallowing.   Respiratory: Negative for shortness of breath and stridor.   Musculoskeletal: Negative for myalgias, neck pain and neck stiffness.  Skin: Negative for color change.  Allergic/Immunologic: Negative for immunocompromised state.  Neurological: Positive for headaches.  Psychiatric/Behavioral: Negative for self-injury.      Allergies  Review of patient's allergies indicates no known  allergies.  Home Medications   Prior to Admission medications   Medication Sig Start Date End Date Taking? Authorizing Provider  HYDROcodone-acetaminophen (NORCO/VICODIN) 5-325 MG per tablet Take 1 tablet by mouth every 6 (six) hours as needed for moderate pain. Patient not taking: Reported on 06/16/2015 09/08/14   Domenic Moras, PA-C  penicillin v potassium (VEETID) 500 MG tablet Take 1 tablet (500 mg total) by mouth 3 (three) times daily. Patient not taking: Reported on 06/16/2015 09/08/14   Domenic Moras, PA-C   BP 136/87 mmHg  Pulse 72  Temp(Src) 97.9 F (36.6 C) (Oral)  Resp 19  Ht 5\' 9"  (1.753 m)  Wt 150 lb (68.04 kg)  BMI 22.14 kg/m2  SpO2 99% Physical Exam  Constitutional: He appears well-developed and well-nourished. No distress.  HENT:  Head: Atraumatic. Macrocephalic.  Mouth/Throat: Uvula is midline and oropharynx is clear and moist. Mucous membranes are not dry. No uvula swelling. No oropharyngeal exudate, posterior oropharyngeal edema, posterior oropharyngeal erythema or tonsillar abscesses.  Right upper second molar with dark decay.  Right upper first molar with decay to the level of the gingiva.  No erythema, edema, discharge.  Mild tenderness.  No facial swelling.    Neck: Normal range of motion. Neck supple.  Cardiovascular: Normal rate.   Pulmonary/Chest: Effort normal and breath sounds normal. No stridor.  Lymphadenopathy:    He has no cervical adenopathy.  Neurological: He is alert.  Skin: He is not diaphoretic.  Nursing note and vitals reviewed.   ED Course  Procedures (including critical care time) Labs Review Labs Reviewed - No data to display  Imaging Review No  results found.   EKG Interpretation None      MDM   Final diagnoses:  Pain, dental   Afebrile nontoxic patient c/o dental pain.  Obvious caries.  No apparent abscess.  No red flags.  Doubt ludwig's angina.  Pt offered dental block, which he agreed to but pt eloped prior to this treatment.  I  was advised by staff after patient's room was found to be empty.       Clayton Bibles, PA-C 06/16/15 0900  Ernestina Patches, MD 06/17/15 1032

## 2015-06-16 NOTE — ED Notes (Signed)
Patient reports having dental pain in the upper right side. Describes as "a drill going into it". Reports being seen for same but was not able to follow up with dentist. Evelina Bucy motrin last yesterday afternoon around 3pm with no relief.

## 2015-06-16 NOTE — ED Notes (Signed)
Med not in pixis.  Pharmacy called and med to be sent.

## 2019-01-11 ENCOUNTER — Encounter (HOSPITAL_COMMUNITY): Payer: Self-pay

## 2019-01-11 ENCOUNTER — Emergency Department (HOSPITAL_COMMUNITY)
Admission: EM | Admit: 2019-01-11 | Discharge: 2019-01-11 | Disposition: A | Discharge status: Home / Self Care | Payer: Self-pay | Attending: Emergency Medicine | Admitting: Emergency Medicine

## 2019-01-11 DIAGNOSIS — D171 Benign lipomatous neoplasm of skin and subcutaneous tissue of trunk: Secondary | ICD-10-CM | POA: Insufficient documentation

## 2019-01-11 DIAGNOSIS — Z79899 Other long term (current) drug therapy: Secondary | ICD-10-CM | POA: Insufficient documentation

## 2019-01-11 LAB — RAPID URINE DRUG SCREEN, HOSP PERFORMED
Amphetamines: NOT DETECTED
BARBITURATES: NOT DETECTED
Benzodiazepines: NOT DETECTED
Cocaine: NOT DETECTED
Opiates: NOT DETECTED
Tetrahydrocannabinol: POSITIVE — AB

## 2019-01-11 LAB — COMPREHENSIVE METABOLIC PANEL
ALBUMIN: 4.6 g/dL (ref 3.5–5.0)
ALT: 30 U/L (ref 0–44)
ANION GAP: 10 (ref 5–15)
AST: 23 U/L (ref 15–41)
Alkaline Phosphatase: 65 U/L (ref 38–126)
BUN: 14 mg/dL (ref 6–20)
CALCIUM: 8.7 mg/dL — AB (ref 8.9–10.3)
CO2: 23 mmol/L (ref 22–32)
Chloride: 110 mmol/L (ref 98–111)
Creatinine, Ser: 0.85 mg/dL (ref 0.61–1.24)
GFR calc Af Amer: >60 mL/min (ref >60)
GLUCOSE: 106 mg/dL — AB (ref 70–99)
Potassium: 3.8 mmol/L (ref 3.5–5.1)
Sodium: 143 mmol/L (ref 135–145)
TOTAL PROTEIN: 7.8 g/dL (ref 6.5–8.1)
Total Bilirubin: 0.6 mg/dL (ref 0.3–1.2)

## 2019-01-11 LAB — CBC WITH DIFFERENTIAL/PLATELET
Abs Immature Granulocytes: 0.03 10*3/uL (ref 0.00–0.07)
BASOS PCT: 1 %
Basophils Absolute: 0.1 10*3/uL (ref 0.0–0.1)
EOS ABS: 0.6 10*3/uL — AB (ref 0.0–0.5)
Eosinophils Relative: 6 %
HCT: 47.8 % (ref 39.0–52.0)
Hemoglobin: 15.4 g/dL (ref 13.0–17.0)
IMMATURE GRANULOCYTES: 0 %
Lymphocytes Relative: 46 %
Lymphs Abs: 4.6 10*3/uL — ABNORMAL HIGH (ref 0.7–4.0)
MCH: 30 pg (ref 26.0–34.0)
MCHC: 32.2 g/dL (ref 30.0–36.0)
MCV: 93 fL (ref 80.0–100.0)
MONO ABS: 0.6 10*3/uL (ref 0.1–1.0)
MONOS PCT: 6 %
NEUTROS PCT: 41 %
Neutro Abs: 4.1 10*3/uL (ref 1.7–7.7)
PLATELETS: 324 10*3/uL (ref 150–400)
RBC: 5.14 MIL/uL (ref 4.22–5.81)
RDW: 13.5 % (ref 11.5–15.5)
WBC: 9.9 10*3/uL (ref 4.0–10.5)
nRBC: 0 % (ref 0.0–0.2)

## 2019-01-11 LAB — ETHANOL: Alcohol, Ethyl (B): 387 mg/dL (ref <10)

## 2019-01-11 NOTE — ED Notes (Signed)
Date and time results received: 01/11/19 2052 (use smartphrase ".now" to insert current time)  Test: alcohol Critical Value: 387  Name of Provider Notified: Dr. Lita Mains  Orders Received? Or Actions Taken?: Actions Taken: notified Dr Lita Mains of alcohol 387.

## 2019-01-11 NOTE — Discharge Instructions (Addendum)
Contact a health care provider if: Your lipoma becomes larger or hard. Your lipoma becomes painful, red, or increasingly swollen. These could be signs of infection or a more serious condition. Get help right away if: You develop tingling or numbness in an area near the lipoma. This could indicate that the lipoma is causing nerve damage.

## 2019-01-11 NOTE — ED Notes (Signed)
Provider made aware.
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
## 2019-01-11 NOTE — ED Notes (Signed)
Bed: [facility] Expected date:  Expected time:  Means of arrival:  Comments: 

## 2019-01-11 NOTE — ED Provider Notes (Signed)
[facility] DEPT Provider Note   CSN: [id number] Arrival date & time: 01/11/19  1936     History   Chief Complaint Chief Complaint  Patient presents with  . Cyst    HPI PEACE Timothy Estrada is a 48 y.o. male.  With a past medical have a history of alcohol abuse who presents emergency department for evaluation of a right breast mass.  Patient has had a "cyst" on his chest wall for the past 11 years.  He states that he feels very self-conscious like about it because it looks like he "has a breast."  He states that he saw a surgeon and paid them however they never removed his cyst.  He denies any acute pain.  The patient appears somewhat intoxicated does admit to drinking today.  Has been pacing the hallways prior to my evaluation.  HPI  Past Medical History:  Diagnosis Date  . Medical history non-contributory     There are no active problems to display for this patient.   Past Surgical History:  Procedure Laterality Date  . FRACTURE SURGERY     Hx of right ankle  . ORIF CLAVICULAR FRACTURE Right 04/29/2013   Procedure: OPEN REDUCTION INTERNAL FIXATION (ORIF) CLAVICULAR FRACTURE;  Surgeon: Rozanna Box, MD;  Location: [place];  Service: Orthopedics;  Laterality: Right;  Aspiration of right breast mass,? sebaceaous cyst        Home Medications    Prior to Admission medications   Medication Sig Start Date End Date Taking? Authorizing Provider  acetaminophen (TYLENOL) 500 MG tablet Take 1,000 mg by mouth daily as needed for headache.   Yes [provider]  HYDROcodone-acetaminophen (NORCO/VICODIN) 5-325 MG per tablet Take 1 tablet by mouth every 6 (six) hours as needed for moderate pain. Patient not taking: Reported on 01/11/2019 09/08/14   Domenic Moras, PA-C  penicillin v potassium (VEETID) 500 MG tablet Take 1 tablet (500 mg total) by mouth 3 (three) times daily. Patient not taking: Reported on 01/11/2019 09/08/14   Domenic Moras, PA-C    Family  History No family history on file.  Social History Social History   Tobacco Use  . Smoking status: Current Every Day Smoker    Packs/day: 0.50  Substance Use Topics  . Alcohol use: Yes    Comment: occasional  . Drug use: No     Allergies   Patient has no known allergies.   Review of Systems Review of Systems  Ten systems reviewed and are negative for acute change, except as noted in the HPI.   Physical Exam Updated Vital Signs BP (!) 131/117 (BP Location: Left Arm)   Pulse 83   Temp 97.9 F (36.6 C) (Oral)   Resp 18   Ht 5\' 9"  (1.753 m)   Wt 68 kg   SpO2 95%   BMI 22.15 kg/m   Physical Exam Vitals signs and nursing note reviewed.  Constitutional:      General: He is not in acute distress.    Appearance: He is well-developed. He is not diaphoretic.  HENT:     Head: Normocephalic and atraumatic.  Eyes:     General: No scleral icterus.    Conjunctiva/sclera: Conjunctivae normal.  Neck:     Musculoskeletal: Normal range of motion and neck supple.  Cardiovascular:     Rate and Rhythm: Normal rate and regular rhythm.     Heart sounds: Normal heart sounds.  Pulmonary:     Effort: Pulmonary effort is normal.  No respiratory distress.     Breath sounds: Normal breath sounds.  Chest:     Breasts:        Right: Mass present. No nipple discharge or skin change.     Abdominal:     Palpations: Abdomen is soft.     Tenderness: There is no abdominal tenderness.  Skin:    General: Skin is warm and dry.  Neurological:     Mental Status: He is alert.  Psychiatric:        Behavior: Behavior normal.      ED Treatments / Results  Labs (all labs ordered are listed, but only abnormal results are displayed) Labs Reviewed  COMPREHENSIVE METABOLIC PANEL - Abnormal; Notable for the following components:      Result Value   Glucose, Bld 106 (*)    Calcium 8.7 (*)    All other components within normal limits  CBC WITH DIFFERENTIAL/PLATELET - Abnormal; Notable for  the following components:   Lymphs Abs 4.6 (*)    Eosinophils Absolute 0.6 (*)    All other components within normal limits  ETHANOL - Abnormal; Notable for the following components:   Alcohol, Ethyl (B) 387 (*)    All other components within normal limits  RAPID URINE DRUG SCREEN, HOSP PERFORMED - Abnormal; Notable for the following components:   Tetrahydrocannabinol POSITIVE (*)    All other components within normal limits    EKG None  Radiology No results found.  Procedures Procedures (including critical care time)  Medications Ordered in ED Medications - No data to display   Initial Impression / Assessment and Plan / ED Course  I have reviewed the triage vital signs and the nursing notes.  Pertinent labs & imaging results that were available during my care of the patient were reviewed by me and considered in my medical decision making (see chart for details).     Patient is safely ambulatory in the emergency department despite his alcohol level.  He is able to answer questions appropriately.  Patient has a longstanding mass which is likely a lipoma and will be referred to outpatient surgery for further evaluation.  He appears otherwise appropriate for discharge at this time  Final Clinical Impressions(s) / ED Diagnoses   Final diagnoses:  Lipoma of anterior chest wall    ED Discharge Orders    None       Margarita Mail, PA-C 01/12/19 0008    Julianne Rice, MD 01/15/19 2125

## 2019-01-11 NOTE — ED Notes (Signed)
Pt left prior to be giving discharge summaries. Pt states " I dont need no subscription, and I am not waiting any more".

## 2020-04-06 ENCOUNTER — Emergency Department (HOSPITAL_COMMUNITY)
Admission: EM | Admit: 2020-04-06 | Discharge: 2020-04-07 | Disposition: A | Discharge status: Home / Self Care | Payer: Self-pay | Attending: Emergency Medicine | Admitting: Emergency Medicine

## 2020-04-06 ENCOUNTER — Other Ambulatory Visit: Payer: Self-pay

## 2020-04-06 DIAGNOSIS — R600 Localized edema: Secondary | ICD-10-CM | POA: Insufficient documentation

## 2020-04-06 DIAGNOSIS — Z5321 Procedure and treatment not carried out due to patient leaving prior to being seen by health care provider: Secondary | ICD-10-CM | POA: Insufficient documentation

## 2020-04-06 DIAGNOSIS — M25522 Pain in left elbow: Secondary | ICD-10-CM | POA: Insufficient documentation

## 2020-04-07 ENCOUNTER — Other Ambulatory Visit: Payer: Self-pay

## 2020-04-07 ENCOUNTER — Encounter (HOSPITAL_COMMUNITY): Payer: Self-pay | Admitting: Emergency Medicine

## 2020-04-07 LAB — COMPREHENSIVE METABOLIC PANEL
ALT: 20 U/L (ref 0–44)
AST: 23 U/L (ref 15–41)
Albumin: 4 g/dL (ref 3.5–5.0)
Alkaline Phosphatase: 68 U/L (ref 38–126)
Anion gap: 12 (ref 5–15)
BUN: 8 mg/dL (ref 6–20)
CO2: 21 mmol/L — ABNORMAL LOW (ref 22–32)
Calcium: 8.7 mg/dL — ABNORMAL LOW (ref 8.9–10.3)
Chloride: 109 mmol/L (ref 98–111)
Creatinine, Ser: 0.93 mg/dL (ref 0.61–1.24)
GFR calc Af Amer: >60 mL/min (ref >60)
GFR calc non Af Amer: >60 mL/min (ref >60)
Glucose, Bld: 101 mg/dL — ABNORMAL HIGH (ref 70–99)
Potassium: 4.1 mmol/L (ref 3.5–5.1)
Sodium: 142 mmol/L (ref 135–145)
Total Bilirubin: 0.1 mg/dL — ABNORMAL LOW (ref 0.3–1.2)
Total Protein: 7.8 g/dL (ref 6.5–8.1)

## 2020-04-07 LAB — CBC WITH DIFFERENTIAL/PLATELET
Abs Immature Granulocytes: 0.04 10*3/uL (ref 0.00–0.07)
Basophils Absolute: 0.1 10*3/uL (ref 0.0–0.1)
Basophils Relative: 1 %
Eosinophils Absolute: 0.2 10*3/uL (ref 0.0–0.5)
Eosinophils Relative: 2 %
HCT: 47.2 % (ref 39.0–52.0)
Hemoglobin: 15.3 g/dL (ref 13.0–17.0)
Immature Granulocytes: 0 %
Lymphocytes Relative: 25 %
Lymphs Abs: 2.8 10*3/uL (ref 0.7–4.0)
MCH: 30.6 pg (ref 26.0–34.0)
MCHC: 32.4 g/dL (ref 30.0–36.0)
MCV: 94.4 fL (ref 80.0–100.0)
Monocytes Absolute: 0.8 10*3/uL (ref 0.1–1.0)
Monocytes Relative: 7 %
Neutro Abs: 7.1 10*3/uL (ref 1.7–7.7)
Neutrophils Relative %: 65 %
Platelets: 435 10*3/uL — ABNORMAL HIGH (ref 150–400)
RBC: 5 MIL/uL (ref 4.22–5.81)
RDW: 12.8 % (ref 11.5–15.5)
WBC: 10.9 10*3/uL — ABNORMAL HIGH (ref 4.0–10.5)
nRBC: 0 % (ref 0.0–0.2)

## 2020-04-07 LAB — LACTIC ACID, PLASMA: Lactic Acid, Venous: 1.5 mmol/L (ref 0.5–1.9)

## 2020-04-07 MED ORDER — SODIUM CHLORIDE 0.9% FLUSH: 3.0000 mL | Freq: Once | INTRAVENOUS | Status: DC

## 2020-04-07 NOTE — ED Triage Notes (Signed)
Pt c/o swelling and pain to left elbow x 4 days. Reports there was some fluid draining from area yesterday.

## 2020-04-07 NOTE — ED Notes (Signed)
Pt no response to vital recheck x3

## 2021-08-30 ENCOUNTER — Emergency Department (HOSPITAL_COMMUNITY)
Admission: EM | Admit: 2021-08-30 | Discharge: 2021-08-30 | Disposition: A | Discharge status: Home / Self Care | Payer: Self-pay | Attending: Emergency Medicine | Admitting: Emergency Medicine

## 2021-08-30 ENCOUNTER — Emergency Department (HOSPITAL_COMMUNITY): Payer: Self-pay

## 2021-08-30 ENCOUNTER — Encounter (HOSPITAL_COMMUNITY): Payer: Self-pay | Admitting: Emergency Medicine

## 2021-08-30 DIAGNOSIS — D72829 Elevated white blood cell count, unspecified: Secondary | ICD-10-CM | POA: Insufficient documentation

## 2021-08-30 DIAGNOSIS — F17210 Nicotine dependence, cigarettes, uncomplicated: Secondary | ICD-10-CM | POA: Insufficient documentation

## 2021-08-30 DIAGNOSIS — Y92410 Unspecified street and highway as the place of occurrence of the external cause: Secondary | ICD-10-CM | POA: Insufficient documentation

## 2021-08-30 DIAGNOSIS — S2242XA Multiple fractures of ribs, left side, initial encounter for closed fracture: Secondary | ICD-10-CM | POA: Insufficient documentation

## 2021-08-30 LAB — CBC WITH DIFFERENTIAL/PLATELET
Abs Immature Granulocytes: 0.06 10*3/uL (ref 0.00–0.07)
Basophils Absolute: 0.1 10*3/uL (ref 0.0–0.1)
Basophils Relative: 1 %
Eosinophils Absolute: 0.2 10*3/uL (ref 0.0–0.5)
Eosinophils Relative: 2 %
HCT: 47.4 % (ref 39.0–52.0)
Hemoglobin: 15.2 g/dL (ref 13.0–17.0)
Immature Granulocytes: 1 %
Lymphocytes Relative: 18 %
Lymphs Abs: 2.4 10*3/uL (ref 0.7–4.0)
MCH: 30.7 pg (ref 26.0–34.0)
MCHC: 32.1 g/dL (ref 30.0–36.0)
MCV: 95.8 fL (ref 80.0–100.0)
Monocytes Absolute: 1.3 10*3/uL — ABNORMAL HIGH (ref 0.1–1.0)
Monocytes Relative: 10 %
Neutro Abs: 8.9 10*3/uL — ABNORMAL HIGH (ref 1.7–7.7)
Neutrophils Relative %: 68 %
Platelets: 313 10*3/uL (ref 150–400)
RBC: 4.95 MIL/uL (ref 4.22–5.81)
RDW: 13.3 % (ref 11.5–15.5)
WBC: 12.8 10*3/uL — ABNORMAL HIGH (ref 4.0–10.5)
nRBC: 0 % (ref 0.0–0.2)

## 2021-08-30 LAB — URINALYSIS, ROUTINE W REFLEX MICROSCOPIC
Bilirubin Urine: NEGATIVE
Glucose, UA: NEGATIVE mg/dL
Hgb urine dipstick: NEGATIVE
Ketones, ur: NEGATIVE mg/dL
Leukocytes,Ua: NEGATIVE
Nitrite: NEGATIVE
Protein, ur: NEGATIVE mg/dL
Specific Gravity, Urine: 1.028 (ref 1.005–1.030)
pH: 5 (ref 5.0–8.0)

## 2021-08-30 LAB — COMPREHENSIVE METABOLIC PANEL
ALT: 24 U/L (ref 0–44)
AST: 25 U/L (ref 15–41)
Albumin: 4.2 g/dL (ref 3.5–5.0)
Alkaline Phosphatase: 78 U/L (ref 38–126)
Anion gap: 13 (ref 5–15)
BUN: 13 mg/dL (ref 6–20)
CO2: 23 mmol/L (ref 22–32)
Calcium: 9.9 mg/dL (ref 8.9–10.3)
Chloride: 102 mmol/L (ref 98–111)
Creatinine, Ser: 0.96 mg/dL (ref 0.61–1.24)
GFR, Estimated: >60 mL/min (ref >60)
Glucose, Bld: 103 mg/dL — ABNORMAL HIGH (ref 70–99)
Potassium: 4.4 mmol/L (ref 3.5–5.1)
Sodium: 138 mmol/L (ref 135–145)
Total Bilirubin: 0.8 mg/dL (ref 0.3–1.2)
Total Protein: 7.5 g/dL (ref 6.5–8.1)

## 2021-08-30 LAB — I-STAT CHEM 8, ED
BUN: 16 mg/dL (ref 6–20)
Calcium, Ion: 1.18 mmol/L (ref 1.15–1.40)
Chloride: 103 mmol/L (ref 98–111)
Creatinine, Ser: 1 mg/dL (ref 0.61–1.24)
Glucose, Bld: 104 mg/dL — ABNORMAL HIGH (ref 70–99)
HCT: 47 % (ref 39.0–52.0)
Hemoglobin: 16 g/dL (ref 13.0–17.0)
Potassium: 4.3 mmol/L (ref 3.5–5.1)
Sodium: 138 mmol/L (ref 135–145)
TCO2: 25 mmol/L (ref 22–32)

## 2021-08-30 MED ORDER — OXYCODONE-ACETAMINOPHEN 5-325 MG PO TABS: 2.0000 | ORAL_TABLET | Freq: Four times a day (QID) | ORAL | 0 refills | Status: AC | PRN

## 2021-08-30 MED ORDER — OXYCODONE-ACETAMINOPHEN 5-325 MG PO TABS
1.0000 | ORAL_TABLET | Freq: Once | ORAL | Status: AC
Start: 2021-08-30 — End: 2021-08-30
  Administered 2021-08-30: 1 via ORAL
  Filled 2021-08-30: qty 1

## 2021-08-30 MED ORDER — OXYCODONE-ACETAMINOPHEN 5-325 MG PO TABS
1.0000 | ORAL_TABLET | Freq: Once | ORAL | Status: AC
  Administered 2021-08-30: 1 via ORAL
  Filled 2021-08-30: qty 1

## 2021-08-30 MED ORDER — IOHEXOL 350 MG/ML SOLN
70.0000 mL | Freq: Once | INTRAVENOUS | Status: AC | PRN
  Administered 2021-08-30: 70 mL via INTRAVENOUS

## 2021-08-30 MED ORDER — OXYCODONE-ACETAMINOPHEN 5-325 MG PO TABS: 1.0000 | ORAL_TABLET | Freq: Once | ORAL | Status: DC

## 2021-08-30 MED ORDER — LIDOCAINE 4 % EX PTCH: 1.0000 | MEDICATED_PATCH | Freq: Two times a day (BID) | CUTANEOUS | 0 refills | Status: DC | PRN
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
## 2021-08-30 NOTE — ED Notes (Signed)
Pt given discharge paperwork and prescription. Pt understands instructions. ESignature pad not available. Pt agreeable to discharge.
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
## 2021-09-28 ENCOUNTER — Encounter (HOSPITAL_COMMUNITY): Payer: Self-pay | Admitting: Radiology

## 2023-04-20 ENCOUNTER — Emergency Department (HOSPITAL_COMMUNITY)
Admission: EM | Admit: 2023-04-20 | Discharge: 2023-04-20 | Disposition: A | Discharge status: Home / Self Care | Payer: Self-pay | Attending: Emergency Medicine | Admitting: Emergency Medicine

## 2023-04-20 ENCOUNTER — Encounter (HOSPITAL_COMMUNITY): Payer: Self-pay

## 2023-04-20 ENCOUNTER — Other Ambulatory Visit: Payer: Self-pay

## 2023-04-20 DIAGNOSIS — S01511A Laceration without foreign body of lip, initial encounter: Secondary | ICD-10-CM | POA: Insufficient documentation

## 2023-04-20 DIAGNOSIS — Z23 Encounter for immunization: Secondary | ICD-10-CM | POA: Insufficient documentation

## 2023-04-20 MED ORDER — OXYCODONE HCL 5 MG PO TABS
5.0000 mg | ORAL_TABLET | Freq: Once | ORAL | Status: AC
  Administered 2023-04-20: 5 mg via ORAL
  Filled 2023-04-20: qty 1

## 2023-04-20 MED ORDER — KETOROLAC TROMETHAMINE 15 MG/ML IJ SOLN
15.0000 mg | Freq: Once | INTRAMUSCULAR | Status: AC
  Administered 2023-04-20: 15 mg via INTRAMUSCULAR
  Filled 2023-04-20: qty 1

## 2023-04-20 MED ORDER — TETANUS-DIPHTH-ACELL PERTUSSIS 5-2.5-18.5 LF-MCG/0.5 IM SUSY
0.5000 mL | PREFILLED_SYRINGE | Freq: Once | INTRAMUSCULAR | Status: AC
  Administered 2023-04-20: 0.5 mL via INTRAMUSCULAR
  Filled 2023-04-20: qty 0.5

## 2023-04-20 MED ORDER — LIDOCAINE HCL (PF) 1 % IJ SOLN
5.0000 mL | Freq: Once | INTRAMUSCULAR | Status: AC
  Administered 2023-04-20: 5 mL via INTRADERMAL
  Filled 2023-04-20: qty 30

## 2023-04-20 MED ORDER — ACETAMINOPHEN 500 MG PO TABS
1000.0000 mg | ORAL_TABLET | Freq: Once | ORAL | Status: AC
  Administered 2023-04-20: 1000 mg via ORAL
  Filled 2023-04-20: qty 2
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
## 2023-06-11 ENCOUNTER — Ambulatory Visit (HOSPITAL_COMMUNITY)
Admission: EM | Admit: 2023-06-11 | Discharge: 2023-06-11 | Discharge status: Against Medical Advice | Payer: No Payment, Other | Attending: Psychiatry | Admitting: Psychiatry

## 2023-06-11 DIAGNOSIS — R4589 Other symptoms and signs involving emotional state: Secondary | ICD-10-CM

## 2023-06-11 DIAGNOSIS — F1290 Cannabis use, unspecified, uncomplicated: Secondary | ICD-10-CM | POA: Insufficient documentation

## 2023-06-11 DIAGNOSIS — F17210 Nicotine dependence, cigarettes, uncomplicated: Secondary | ICD-10-CM | POA: Insufficient documentation

## 2023-06-11 DIAGNOSIS — F1010 Alcohol abuse, uncomplicated: Secondary | ICD-10-CM | POA: Insufficient documentation

## 2023-06-11 MED ORDER — LORAZEPAM 1 MG PO TABS: 1.0000 mg | ORAL_TABLET | Freq: Four times a day (QID) | ORAL | Status: DC | PRN

## 2023-06-11 MED ORDER — THIAMINE HCL 100 MG/ML IJ SOLN: 100.0000 mg | Freq: Once | INTRAMUSCULAR | Status: DC

## 2023-06-11 MED ORDER — ADULT MULTIVITAMIN W/MINERALS CH
1.0000 | ORAL_TABLET | Freq: Every day | ORAL | Status: DC
Start: 2023-06-12 — End: 2023-06-12

## 2023-06-11 MED ORDER — HYDROXYZINE HCL 25 MG PO TABS: 25.0000 mg | ORAL_TABLET | Freq: Four times a day (QID) | ORAL | Status: DC | PRN

## 2023-06-11 MED ORDER — ALUM & MAG HYDROXIDE-SIMETH 200-200-20 MG/5ML PO SUSP: 30.0000 mL | ORAL | Status: DC | PRN

## 2023-06-11 MED ORDER — OLANZAPINE 5 MG PO TBDP: 5.0000 mg | ORAL_TABLET | Freq: Three times a day (TID) | ORAL | Status: DC | PRN

## 2023-06-11 MED ORDER — ACETAMINOPHEN 325 MG PO TABS: 650.0000 mg | ORAL_TABLET | Freq: Four times a day (QID) | ORAL | Status: DC | PRN

## 2023-06-11 MED ORDER — LORAZEPAM 1 MG PO TABS: 1.0000 mg | ORAL_TABLET | Freq: Three times a day (TID) | ORAL | Status: DC

## 2023-06-11 MED ORDER — LORAZEPAM 1 MG PO TABS
1.0000 mg | ORAL_TABLET | Freq: Four times a day (QID) | ORAL | Status: DC
Start: 2023-06-12 — End: 2023-06-12

## 2023-06-11 MED ORDER — LOPERAMIDE HCL 2 MG PO CAPS: 2.0000 mg | ORAL_CAPSULE | ORAL | Status: DC | PRN

## 2023-06-11 MED ORDER — ZIPRASIDONE MESYLATE 20 MG IM SOLR: 20.0000 mg | INTRAMUSCULAR | Status: DC | PRN

## 2023-06-11 MED ORDER — THIAMINE MONONITRATE 100 MG PO TABS: 100.0000 mg | ORAL_TABLET | Freq: Every day | ORAL | Status: DC

## 2023-06-11 MED ORDER — LORAZEPAM 1 MG PO TABS: 1.0000 mg | ORAL_TABLET | Freq: Every day | ORAL | Status: DC

## 2023-06-11 MED ORDER — ONDANSETRON 4 MG PO TBDP: 4.0000 mg | ORAL_TABLET | Freq: Four times a day (QID) | ORAL | Status: DC | PRN

## 2023-06-11 MED ORDER — MAGNESIUM HYDROXIDE 400 MG/5ML PO SUSP: 30.0000 mL | Freq: Every day | ORAL | Status: DC | PRN

## 2023-06-11 MED ORDER — LORAZEPAM 1 MG PO TABS: 1.0000 mg | ORAL_TABLET | Freq: Two times a day (BID) | ORAL | Status: DC

## 2023-06-11 NOTE — ED Provider Notes (Signed)
[facility] Continuous Assessment Admission H&P  Date: 06/11/23 Patient Name: Timothy Estrada MRN: [id number] Chief Complaint: alcohol abuse  Diagnoses:  Final diagnoses:  Alcohol abuse  Anxious appearance  Marijuana use    HPI: Timothy Estrada, 52 y/o male with a history of alcohol abuse, presented to [facility] via [facility], after they were called by his parents.  According to the patient he is here because his parents called the police to get him some help for alcohol abuse according to the patient he drinks alcohol on a daily basis.  Last time he consumed alcohol was prior to coming in he had a couple shots.  According to patient he has never tried detox before, but stated he would like some help now.  Per the patient he lives at home with his mom and father.  Patient is currently not seeing a psychiatrist or therapist.  Patient is currently not taking any medications.  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    Face-to-face observation of patient, patient is alert and oriented x 4, speech is clear, maintaining eye contact.  Patient cooperative and answers questions appropriately.  Patient denies SI, HI, AVH or paranoia.  Patient reported he smoked marijuana and [place] cigarettes.  According to patient he drinks alcohol on a daily basis and could not give a definite amount but stating he drinks a couple each time.  This time patient does not seem to be influenced by internal or external stimuli.  Does not appear to be a threat to himself or others. Patient desired to enter into detox.  At this time patient does not display any withdrawal symptoms.  Discussed with patient the detox program plan patient in agreement.  Recommend inpatient ops until a bed is available at [facility].  Total Time spent with patient: 20 minutes  Musculoskeletal  Strength & Muscle Tone: within normal limits Gait & Station: normal Patient leans: N/A  Psychiatric Specialty Exam  Presentation General Appearance:  Casual  Eye  Contact: Good  Speech: Clear and Coherent  Speech Volume: Normal  Handedness: Right   Mood and Affect  Mood: Anxious  Affect: Appropriate   Thought Process  Thought Processes: Coherent  Descriptions of Associations:Circumstantial  Orientation:Full (Time, Place and Person)  Thought Content:WDL    Hallucinations:Hallucinations: None  Ideas of Reference:None  Suicidal Thoughts:Suicidal Thoughts: No  Homicidal Thoughts:Homicidal Thoughts: No   Sensorium  Memory: Immediate Good  Judgment: Fair  Insight: Good   Executive Functions  Concentration: Good  Attention Span: Good  Recall: Good  Fund of Knowledge: Good  Language: Good   Psychomotor Activity  Psychomotor Activity: Psychomotor Activity: Normal   Assets  Assets: Desire for Improvement; Social Support   Sleep  Sleep: Sleep: Fair Number of Hours of Sleep: 6   Nutritional Assessment (For OBS and [facility]BC admissions only) Has the patient had a weight loss or gain of 10 pounds or more in the last 3 months?: No Has the patient had a decrease in food intake/or appetite?: No Does the patient have dental problems?: No Does the patient have eating habits or behaviors that may be indicators of an eating disorder including binging or inducing vomiting?: No Has the patient recently lost weight without trying?: 0 Has the patient been eating poorly because of a decreased appetite?: 0 Malnutrition Screening Tool Score: 0    Physical Exam HENT:     Head: Normocephalic.     Nose: Nose normal.  Cardiovascular:     Rate and Rhythm:  Normal rate.  Pulmonary:     Effort: Pulmonary effort is normal.  Musculoskeletal:        General: Normal range of motion.     Cervical back: Normal range of motion.  Skin:    General: Skin is warm.  Neurological:     General: No focal deficit present.     Mental Status: He is alert.  Psychiatric:        Mood and Affect: Mood normal.        Behavior:  Behavior normal.        Thought Content: Thought content normal.        Judgment: Judgment normal.    Review of Systems  Constitutional: Negative.   HENT: Negative.    Eyes: Negative.   Respiratory: Negative.    Cardiovascular: Negative.   Gastrointestinal: Negative.   Genitourinary: Negative.   Musculoskeletal: Negative.   Skin: Negative.   Neurological: Negative.   Psychiatric/Behavioral:  Positive for substance abuse. The patient is nervous/anxious.     Blood pressure 138/89, pulse 97, temperature 98.3 F (36.8 C), temperature source Oral, resp. rate 18, SpO2 98 %. There is no height or weight on file to calculate BMI.  Past Psychiatric History: Alcohol abuse  Is the patient at risk to self? No  Has the patient been a risk to self in the past 6 months? No .    Has the patient been a risk to self within the distant past? No   Is the patient a risk to others? No   Has the patient been a risk to others in the past 6 months? No   Has the patient been a risk to others within the distant past? No   Past Medical History: See chart  Family History: Unknown  Social History: Marijuana and tobacco use  Last Labs:  No visits with results within 6 Month(s) from this visit.  Latest known visit with results is:  Admission on 08/30/2021, Discharged on 08/30/2021  Component Date Value Ref Range Status   WBC 08/30/2021 12.8 (H)  4.0 - 10.5 K/uL Final   RBC 08/30/2021 4.95  4.22 - 5.81 MIL/uL Final   Hemoglobin 08/30/2021 15.2  13.0 - 17.0 g/dL Final   HCT 46/96/2952 47.4  39.0 - 52.0 % Final   MCV 08/30/2021 95.8  80.0 - 100.0 fL Final   MCH 08/30/2021 30.7  26.0 - 34.0 pg Final   MCHC 08/30/2021 32.1  30.0 - 36.0 g/dL Final   RDW 84/13/2440 13.3  11.5 - 15.5 % Final   Platelets 08/30/2021 313  150 - 400 K/uL Final   nRBC 08/30/2021 0.0  0.0 - 0.2 % Final   Neutrophils Relative % 08/30/2021 68  % Final   Neutro Abs 08/30/2021 8.9 (H)  1.7 - 7.7 K/uL Final   Lymphocytes Relative  08/30/2021 18  % Final   Lymphs Abs 08/30/2021 2.4  0.7 - 4.0 K/uL Final   Monocytes Relative 08/30/2021 10  % Final   Monocytes Absolute 08/30/2021 1.3 (H)  0.1 - 1.0 K/uL Final   Eosinophils Relative 08/30/2021 2  % Final   Eosinophils Absolute 08/30/2021 0.2  0.0 - 0.5 K/uL Final   Basophils Relative 08/30/2021 1  % Final   Basophils Absolute 08/30/2021 0.1  0.0 - 0.1 K/uL Final   Immature Granulocytes 08/30/2021 1  % Final   Abs Immature Granulocytes 08/30/2021 0.06  0.00 - 0.07 K/uL Final   Performed at [facility], [address] [place], [place] [address]   Sodium 08/30/2021 138  135 - 145 mmol/L Final   Potassium 08/30/2021 4.4  3.5 - 5.1 mmol/L Final   Chloride 08/30/2021 102  98 - 111 mmol/L Final   CO2 08/30/2021 23  22 - 32 mmol/L Final   Glucose, Bld 08/30/2021 103 (H)  70 - 99 mg/dL Final   Glucose reference range applies only to samples taken after fasting for at least 8 hours.   BUN 08/30/2021 13  6 - 20 mg/dL Final   Creatinine, Ser 08/30/2021 0.96  0.61 - 1.24 mg/dL Final   Calcium 60/45/4098 9.9  8.9 - 10.3 mg/dL Final   Total Protein 11/91/4782 7.5  6.5 - 8.1 g/dL Final   Albumin 95/62/1308 4.2  3.5 - 5.0 g/dL Final   AST 65/78/4696 25  15 - 41 U/L Final   ALT 08/30/2021 24  0 - 44 U/L Final   Alkaline Phosphatase 08/30/2021 78  38 - 126 U/L Final   Total Bilirubin 08/30/2021 0.8  0.3 - 1.2 mg/dL Final   GFR, Estimated 08/30/2021 >60  >60 mL/min Final   Comment: (NOTE) Calculated using the CKD-EPI Creatinine Equation (2021)    Anion gap 08/30/2021 13  5 - 15 Final   Performed at [facility], [address] [place], [place] [address]   Sodium 08/30/2021 138  135 - 145 mmol/L Final   Potassium 08/30/2021 4.3  3.5 - 5.1 mmol/L Final   Chloride 08/30/2021 103  98 - 111 mmol/L Final   BUN 08/30/2021 16  6 - 20 mg/dL Final   Creatinine, Ser 08/30/2021 1.00  0.61 - 1.24 mg/dL Final   Glucose, Bld 41/32/4401 104 (H)  70 - 99 mg/dL Final   Glucose  reference range applies only to samples taken after fasting for at least 8 hours.   Calcium, Ion 08/30/2021 1.18  1.15 - 1.40 mmol/L Final   TCO2 08/30/2021 25  22 - 32 mmol/L Final   Hemoglobin 08/30/2021 16.0  13.0 - 17.0 g/dL Final   HCT 02/72/5366 47.0  39.0 - 52.0 % Final   Color, Urine 08/30/2021 YELLOW  YELLOW Final   APPearance 08/30/2021 CLEAR  CLEAR Final   Specific Gravity, Urine 08/30/2021 1.028  1.005 - 1.030 Final   pH 08/30/2021 5.0  5.0 - 8.0 Final   Glucose, UA 08/30/2021 NEGATIVE  NEGATIVE mg/dL Final   Hgb urine dipstick 08/30/2021 NEGATIVE  NEGATIVE Final   Bilirubin Urine 08/30/2021 NEGATIVE  NEGATIVE Final   Ketones, ur 08/30/2021 NEGATIVE  NEGATIVE mg/dL Final   Protein, ur 44/01/4741 NEGATIVE  NEGATIVE mg/dL Final   Nitrite 59/56/3875 NEGATIVE  NEGATIVE Final   Leukocytes,Ua 08/30/2021 NEGATIVE  NEGATIVE Final   Performed at [facility], [address], [place], [place] [address]    Allergies: Patient has no known allergies.  Medications:  PTA Medications  Medication Sig   acetaminophen (TYLENOL) 500 MG tablet Take 1,000 mg by mouth daily as needed for headache.   Lidocaine (HM LIDOCAINE PATCH) 4 % PTCH Apply 1 patch topically every 12 (twelve) hours as needed.      Medical Decision Making  Inpatient observation with possibly admission to [facility] when a bed becomes available    Recommendations  Based on my evaluation the patient does not appear to have an emergency medical condition.  Sindy Guadeloupe, NP 06/11/23  11:13 PM

## 2023-06-12 ENCOUNTER — Ambulatory Visit (HOSPITAL_COMMUNITY)
Admission: EM | Admit: 2023-06-12 | Discharge: 2023-06-12 | Disposition: A | Discharge status: Other Acute Inpatient Hospital | Payer: No Payment, Other | Attending: Behavioral Health | Admitting: Behavioral Health

## 2023-06-12 ENCOUNTER — Other Ambulatory Visit (HOSPITAL_COMMUNITY)
Admission: EM | Admit: 2023-06-12 | Discharge: 2023-06-16 | Disposition: A | Discharge status: Home / Self Care | Payer: No Payment, Other | Attending: Psychiatry | Admitting: Psychiatry

## 2023-06-12 DIAGNOSIS — F1210 Cannabis abuse, uncomplicated: Secondary | ICD-10-CM | POA: Insufficient documentation

## 2023-06-12 DIAGNOSIS — F17210 Nicotine dependence, cigarettes, uncomplicated: Secondary | ICD-10-CM | POA: Insufficient documentation

## 2023-06-12 DIAGNOSIS — F1020 Alcohol dependence, uncomplicated: Secondary | ICD-10-CM | POA: Diagnosis not present

## 2023-06-12 DIAGNOSIS — I10 Essential (primary) hypertension: Secondary | ICD-10-CM | POA: Insufficient documentation

## 2023-06-12 LAB — CBC WITH DIFFERENTIAL/PLATELET
Abs Immature Granulocytes: 0.01 10*3/uL (ref 0.00–0.07)
Basophils Absolute: 0 10*3/uL (ref 0.0–0.1)
Basophils Relative: 1 %
Eosinophils Absolute: 0.3 10*3/uL (ref 0.0–0.5)
Eosinophils Relative: 5 %
HCT: 41.7 % (ref 39.0–52.0)
Hemoglobin: 13.8 g/dL (ref 13.0–17.0)
Immature Granulocytes: 0 %
Lymphocytes Relative: 36 %
Lymphs Abs: 2.3 10*3/uL (ref 0.7–4.0)
MCH: 31.4 pg (ref 26.0–34.0)
MCHC: 33.1 g/dL (ref 30.0–36.0)
MCV: 94.8 fL (ref 80.0–100.0)
Monocytes Absolute: 0.8 10*3/uL (ref 0.1–1.0)
Monocytes Relative: 13 %
Neutro Abs: 2.9 10*3/uL (ref 1.7–7.7)
Neutrophils Relative %: 45 %
Platelets: 234 10*3/uL (ref 150–400)
RBC: 4.4 MIL/uL (ref 4.22–5.81)
RDW: 12.8 % (ref 11.5–15.5)
WBC: 6.3 10*3/uL (ref 4.0–10.5)
nRBC: 0 % (ref 0.0–0.2)

## 2023-06-12 LAB — COMPREHENSIVE METABOLIC PANEL
ALT: 30 U/L (ref 0–44)
AST: 26 U/L (ref 15–41)
Albumin: 3.8 g/dL (ref 3.5–5.0)
Alkaline Phosphatase: 62 U/L (ref 38–126)
Anion gap: 10 (ref 5–15)
BUN: 15 mg/dL (ref 6–20)
CO2: 25 mmol/L (ref 22–32)
Calcium: 9 mg/dL (ref 8.9–10.3)
Chloride: 103 mmol/L (ref 98–111)
Creatinine, Ser: 0.92 mg/dL (ref 0.61–1.24)
GFR, Estimated: >60 mL/min (ref >60)
Glucose, Bld: 85 mg/dL (ref 70–99)
Potassium: 4.2 mmol/L (ref 3.5–5.1)
Sodium: 138 mmol/L (ref 135–145)
Total Bilirubin: 0.2 mg/dL — ABNORMAL LOW (ref 0.3–1.2)
Total Protein: 7 g/dL (ref 6.5–8.1)

## 2023-06-12 LAB — LIPID PANEL
Cholesterol: 180 mg/dL (ref 0–200)
HDL: 73 mg/dL (ref >40)
LDL Cholesterol: 90 mg/dL (ref 0–99)
Total CHOL/HDL Ratio: 2.5 RATIO
Triglycerides: 85 mg/dL (ref <150)
VLDL: 17 mg/dL (ref 0–40)

## 2023-06-12 LAB — POCT URINE DRUG SCREEN - MANUAL ENTRY (I-SCREEN)
POC Amphetamine UR: NOT DETECTED
POC Buprenorphine (BUP): NOT DETECTED
POC Cocaine UR: NOT DETECTED
POC Marijuana UR: POSITIVE — AB
POC Methadone UR: NOT DETECTED
POC Methamphetamine UR: NOT DETECTED
POC Morphine: NOT DETECTED
POC Oxazepam (BZO): NOT DETECTED
POC Oxycodone UR: NOT DETECTED
POC Secobarbital (BAR): NOT DETECTED

## 2023-06-12 LAB — ETHANOL: Alcohol, Ethyl (B): <10 mg/dL (ref <10)

## 2023-06-12 LAB — TSH: TSH: 1.757 u[IU]/mL (ref 0.350–4.500)

## 2023-06-12 LAB — RPR: RPR Ser Ql: NONREACTIVE

## 2023-06-12 MED ORDER — ONDANSETRON 4 MG PO TBDP: 4.0000 mg | ORAL_TABLET | Freq: Four times a day (QID) | ORAL | Status: DC | PRN

## 2023-06-12 MED ORDER — LORAZEPAM 1 MG PO TABS: 1.0000 mg | ORAL_TABLET | Freq: Four times a day (QID) | ORAL | Status: DC | PRN

## 2023-06-12 MED ORDER — LOPERAMIDE HCL 2 MG PO CAPS: 2.0000 mg | ORAL_CAPSULE | ORAL | Status: AC | PRN

## 2023-06-12 MED ORDER — LORAZEPAM 1 MG PO TABS
1.0000 mg | ORAL_TABLET | Freq: Four times a day (QID) | ORAL | Status: DC
  Administered 2023-06-12 (×2): 1 mg via ORAL
  Filled 2023-06-12 (×2): qty 1

## 2023-06-12 MED ORDER — TRAZODONE HCL 50 MG PO TABS: 50.0000 mg | ORAL_TABLET | Freq: Every evening | ORAL | Status: DC | PRN

## 2023-06-12 MED ORDER — NICOTINE 21 MG/24HR TD PT24
21.0000 mg | MEDICATED_PATCH | Freq: Once | TRANSDERMAL | Status: AC
  Administered 2023-06-12: 21 mg via TRANSDERMAL
  Filled 2023-06-12: qty 1

## 2023-06-12 MED ORDER — MAGNESIUM HYDROXIDE 400 MG/5ML PO SUSP: 30.0000 mL | Freq: Every day | ORAL | Status: DC | PRN

## 2023-06-12 MED ORDER — CHLORDIAZEPOXIDE HCL 25 MG PO CAPS
25.0000 mg | ORAL_CAPSULE | Freq: Three times a day (TID) | ORAL | Status: AC
  Administered 2023-06-13 – 2023-06-14 (×3): 25 mg via ORAL
  Filled 2023-06-12 (×3): qty 1

## 2023-06-12 MED ORDER — ADULT MULTIVITAMIN W/MINERALS CH
1.0000 | ORAL_TABLET | Freq: Every day | ORAL | Status: DC
  Administered 2023-06-12: 1 via ORAL
  Filled 2023-06-12: qty 1

## 2023-06-12 MED ORDER — ALUM & MAG HYDROXIDE-SIMETH 200-200-20 MG/5ML PO SUSP: 30.0000 mL | ORAL | Status: DC | PRN

## 2023-06-12 MED ORDER — ACETAMINOPHEN 325 MG PO TABS: 650.0000 mg | ORAL_TABLET | Freq: Four times a day (QID) | ORAL | Status: DC | PRN

## 2023-06-12 MED ORDER — THIAMINE MONONITRATE 100 MG PO TABS: 100.0000 mg | ORAL_TABLET | Freq: Every day | ORAL | Status: DC

## 2023-06-12 MED ORDER — CHLORDIAZEPOXIDE HCL 25 MG PO CAPS: 25.0000 mg | ORAL_CAPSULE | Freq: Four times a day (QID) | ORAL | Status: AC | PRN

## 2023-06-12 MED ORDER — HYDROXYZINE HCL 25 MG PO TABS
25.0000 mg | ORAL_TABLET | Freq: Three times a day (TID) | ORAL | Status: DC | PRN
  Administered 2023-06-12 – 2023-06-13 (×2): 25 mg via ORAL
  Filled 2023-06-12 (×2): qty 1

## 2023-06-12 MED ORDER — LORAZEPAM 1 MG PO TABS: 1.0000 mg | ORAL_TABLET | Freq: Two times a day (BID) | ORAL | Status: DC

## 2023-06-12 MED ORDER — ADULT MULTIVITAMIN W/MINERALS CH
1.0000 | ORAL_TABLET | Freq: Every day | ORAL | Status: DC
  Administered 2023-06-13 – 2023-06-16 (×4): 1 via ORAL
  Filled 2023-06-12 (×4): qty 1

## 2023-06-12 MED ORDER — ONDANSETRON 4 MG PO TBDP: 4.0000 mg | ORAL_TABLET | Freq: Four times a day (QID) | ORAL | Status: AC | PRN

## 2023-06-12 MED ORDER — LOPERAMIDE HCL 2 MG PO CAPS: 2.0000 mg | ORAL_CAPSULE | ORAL | Status: DC | PRN

## 2023-06-12 MED ORDER — CHLORDIAZEPOXIDE HCL 25 MG PO CAPS
25.0000 mg | ORAL_CAPSULE | ORAL | Status: AC
  Administered 2023-06-14 – 2023-06-15 (×2): 25 mg via ORAL
  Filled 2023-06-12 (×2): qty 1

## 2023-06-12 MED ORDER — LORAZEPAM 1 MG PO TABS: 1.0000 mg | ORAL_TABLET | Freq: Three times a day (TID) | ORAL | Status: DC

## 2023-06-12 MED ORDER — LORAZEPAM 1 MG PO TABS: 1.0000 mg | ORAL_TABLET | Freq: Every day | ORAL | Status: DC

## 2023-06-12 MED ORDER — CHLORDIAZEPOXIDE HCL 25 MG PO CAPS
25.0000 mg | ORAL_CAPSULE | Freq: Four times a day (QID) | ORAL | Status: AC
  Administered 2023-06-12 – 2023-06-13 (×4): 25 mg via ORAL
  Filled 2023-06-12 (×4): qty 1

## 2023-06-12 MED ORDER — TRAZODONE HCL 50 MG PO TABS
50.0000 mg | ORAL_TABLET | Freq: Every evening | ORAL | Status: DC | PRN
  Administered 2023-06-12 – 2023-06-15 (×4): 50 mg via ORAL
  Filled 2023-06-12 (×4): qty 1

## 2023-06-12 MED ORDER — THIAMINE HCL 100 MG/ML IJ SOLN
100.0000 mg | Freq: Once | INTRAMUSCULAR | Status: AC
  Administered 2023-06-12: 100 mg via INTRAMUSCULAR
  Filled 2023-06-12: qty 2

## 2023-06-12 MED ORDER — THIAMINE MONONITRATE 100 MG PO TABS
100.0000 mg | ORAL_TABLET | Freq: Every day | ORAL | Status: DC
  Administered 2023-06-13 – 2023-06-16 (×4): 100 mg via ORAL
  Filled 2023-06-12 (×4): qty 1

## 2023-06-12 MED ORDER — HYDROXYZINE HCL 25 MG PO TABS: 25.0000 mg | ORAL_TABLET | Freq: Three times a day (TID) | ORAL | Status: DC | PRN

## 2023-06-12 MED ORDER — CHLORDIAZEPOXIDE HCL 25 MG PO CAPS
25.0000 mg | ORAL_CAPSULE | Freq: Every day | ORAL | Status: AC
  Administered 2023-06-15: 25 mg via ORAL
  Filled 2023-06-12: qty 1

## 2023-06-12 NOTE — ED Provider Notes (Addendum)
Facility Based Crisis Admission H&P  Date: 06/13/23 Patient Name: Timothy Estrada MRN: [id number] Chief Complaint: Alcohol detox  Diagnoses:  Final diagnoses:  None    HPI: Timothy Estrada is a 52 year old male with no significant past psychiatric presented to [facility] on 7/10 requesting alcohol detox.  Per chart review, patient has been drinking for many decades, but alcohol use has increased significantly since ~2020 up to 1 pint/one fifth of vodka daily.  Also endorses marijuana and tobacco use.  It appears that his parents called the police requesting escort to alcohol detox, at which point he was brought here.  CIWA: 5 as of 8 AM. Longest period of sobriety: 6 to 7 months about 5 years ago.  Denies history of seizures and DTs.  Patient was begun on Ativan taper, now transitioned to librium.  Will begin with 6 PM dose 7/11 and setting of already-given Ativan earlier in day.  Of note, patient was hypertensive to 151/98 on admit.  All other vitals within normal limits.  Initial labs were entirely unremarkable except for a positive cannabis UDS.  Per patient, no past psychiatric history.  No acute events for the last 24 hours.  On interview, patient has been "drinking too much and has gotten out of hand."  Patient says he has been drinking 1/5 a day since the pandemic which is only gotten worse until now.  Patient said he has been drinking every day all day for a number of years.  He works as a Education administrator and, as he has multiple DWIs (most recently in 2017), he rides a bike to and from work.  Recently, he has been blacking out.  He has been told he has been cursing out neighbors alongside other unruly behaviors, which he does not remember.  Began drinking alcohol around 52 years old.  He denies a history of seizures or DTs on withdrawal.  Patient endorsed marijuana use in social settings, but not regularly.  He is on no prescription medications.  He knows of no allergies to food or drugs.  He has no primary care  provider.  He has never seen a doctor.  He does not retain a therapist or psychiatrist.  Patient currently stays with parents, who are his primary social support.  He gave explicit permission to call his mother: Starling Christofferson [phone].  He has a poor intermediate term memory: his longest period of sobriety he describes as 6 to 7 months at [facility], but does not know how many years ago this was.  Patient is interested in a 30-day substance use treatment program or longer.  Mentioned a willingness to engage in outpatient AA after finishing a [place]-based residential treatment program, which he has never tried in the past.  Also was amenable to starting naltrexone 25 mg daily while here to curb alcohol cravings.  He has no previous history with naltrexone.  Patient endorsed alcohol use disorder in father, mother and brother.  No relevant family medical history.  Denies current medical problems.  Denies current legal issues and access to firearms.    Phone number on file for mother, [place], is nonfunctional.  Will attempt to obtain working number.   PHQ 2-9:   Flowsheet Row ED from 06/12/2023 in [facility] Most recent reading at 06/12/2023  2:35 PM ED from 06/12/2023 in [facility] Most recent reading at 06/12/2023  8:32 AM ED from 06/11/2023 in [facility] Most recent reading at 06/11/2023  11:07 PM  C-SSRS RISK CATEGORY No Risk No Risk No Risk       Screenings    Flowsheet Row Most Recent Value  CIWA-Ar Total 0       Total Time spent with patient: 30 minutes  Musculoskeletal  Strength & Muscle Tone: within normal limits Gait & Station: normal Patient leans: N/A  Psychiatric Specialty Exam  Presentation General Appearance:  Appropriate for Environment; Casual  Eye Contact: Fair  Speech: Garbled  Speech Volume: Normal  Handedness: Right   Mood and Affect   Mood: Euthymic  Affect: Congruent   Thought Process  Thought Processes: Coherent  Descriptions of Associations:Intact  Orientation:Full (Time, Place and Person)  Thought Content:Logical    Hallucinations:Hallucinations: None  Ideas of Reference:None  Suicidal Thoughts:Suicidal Thoughts: No  Homicidal Thoughts:Homicidal Thoughts: No   Sensorium  Memory: Immediate Fair; Recent Poor; Remote Fair  Judgment: Fair  Insight: Fair   Chartered certified accountant: Fair  Attention Span: Fair  Recall: [employer] of Knowledge: Fair  Language: Fair   Psychomotor Activity  Psychomotor Activity: Psychomotor Activity: Normal   Assets  Assets: Communication Skills; Desire for Improvement; Leisure Time; Physical Health; Financial Resources/Insurance; Social Support; Housing   Sleep  Sleep: Sleep: Fair Number of Hours of Sleep: 8   Nutritional Assessment (For OBS and FBC admissions only) Has the patient had a weight loss or gain of 10 pounds or more in the last 3 months?: No Has the patient had a decrease in food intake/or appetite?: No Does the patient have dental problems?: No Does the patient have eating habits or behaviors that may be indicators of an eating disorder including binging or inducing vomiting?: No Has the patient recently lost weight without trying?: 0 Has the patient been eating poorly because of a decreased appetite?: 0 Malnutrition Screening Tool Score: 0    Physical Exam Vitals reviewed.  Constitutional:      General: He is not in acute distress.    Appearance: He is normal weight.  HENT:     Head: Normocephalic and atraumatic.     Mouth/Throat:     Mouth: Mucous membranes are dry.  Eyes:     Extraocular Movements: Extraocular movements intact.  Pulmonary:     Effort: Pulmonary effort is normal.  Abdominal:     General: Abdomen is flat.  Musculoskeletal:        General: Normal range of motion.     Cervical  back: Normal range of motion.  Neurological:     General: No focal deficit present.     Mental Status: He is alert. Mental status is at baseline.  Psychiatric:        Mood and Affect: Mood normal.        Behavior: Behavior normal.    Review of Systems  Psychiatric/Behavioral:  Positive for substance abuse. Negative for hallucinations and suicidal ideas. The patient has insomnia. The patient is not nervous/anxious.     Blood pressure (!) 131/98, pulse 69, temperature 97.6 F (36.4 C), temperature source Oral, resp. rate 17, SpO2 100%. There is no height or weight on file to calculate BMI.  Past Psychiatric History: Patient does not have any previous psychiatric diagnoses.  No previous medication trials.  Has never seen a psychiatrist or needed inpatient services for psychiatric reasons.  Has engaged with inpatient substance use treatment of various kinds over the years, to little effect.  Is the patient at risk to self? No  Has the patient been a  risk to self in the past 6 months? No .    Has the patient been a risk to self within the distant past? No   Is the patient a risk to others? No   Has the patient been a risk to others in the past 6 months? No   Has the patient been a risk to others within the distant past? No   Past Medical History: Denies relevant past medical history. Family History: Denies past family medical history.  EtOH history in mother, brother, father. Social History: Smokes 1 pack of cigarettes a day.  Last Labs:  Admission on 06/12/2023, Discharged on 06/12/2023  Component Date Value Ref Range Status   WBC 06/12/2023 6.3  4.0 - 10.5 K/uL Final   RBC 06/12/2023 4.40  4.22 - 5.81 MIL/uL Final   Hemoglobin 06/12/2023 13.8  13.0 - 17.0 g/dL Final   HCT 16/09/9603 41.7  39.0 - 52.0 % Final   MCV 06/12/2023 94.8  80.0 - 100.0 fL Final   MCH 06/12/2023 31.4  26.0 - 34.0 pg Final   MCHC 06/12/2023 33.1  30.0 - 36.0 g/dL Final   RDW 54/08/8118 12.8  11.5 - 15.5 %  Final   Platelets 06/12/2023 234  150 - 400 K/uL Final   nRBC 06/12/2023 0.0  0.0 - 0.2 % Final   Neutrophils Relative % 06/12/2023 45  % Final   Neutro Abs 06/12/2023 2.9  1.7 - 7.7 K/uL Final   Lymphocytes Relative 06/12/2023 36  % Final   Lymphs Abs 06/12/2023 2.3  0.7 - 4.0 K/uL Final   Monocytes Relative 06/12/2023 13  % Final   Monocytes Absolute 06/12/2023 0.8  0.1 - 1.0 K/uL Final   Eosinophils Relative 06/12/2023 5  % Final   Eosinophils Absolute 06/12/2023 0.3  0.0 - 0.5 K/uL Final   Basophils Relative 06/12/2023 1  % Final   Basophils Absolute 06/12/2023 0.0  0.0 - 0.1 K/uL Final   Immature Granulocytes 06/12/2023 0  % Final   Abs Immature Granulocytes 06/12/2023 0.01  0.00 - 0.07 K/uL Final   Performed at [facility], [address], [place], [place] [address]   Sodium 06/12/2023 138  135 - 145 mmol/L Final   Potassium 06/12/2023 4.2  3.5 - 5.1 mmol/L Final   Chloride 06/12/2023 103  98 - 111 mmol/L Final   CO2 06/12/2023 25  22 - 32 mmol/L Final   Glucose, Bld 06/12/2023 85  70 - 99 mg/dL Final   Glucose reference range applies only to samples taken after fasting for at least 8 hours.   BUN 06/12/2023 15  6 - 20 mg/dL Final   Creatinine, Ser 06/12/2023 0.92  0.61 - 1.24 mg/dL Final   Calcium 95/62/1308 9.0  8.9 - 10.3 mg/dL Final   Total Protein 65/78/4696 7.0  6.5 - 8.1 g/dL Final   Albumin 29/52/8413 3.8  3.5 - 5.0 g/dL Final   AST 24/40/1027 26  15 - 41 U/L Final   ALT 06/12/2023 30  0 - 44 U/L Final   Alkaline Phosphatase 06/12/2023 62  38 - 126 U/L Final   Total Bilirubin 06/12/2023 0.2 (L)  0.3 - 1.2 mg/dL Final   GFR, Estimated 06/12/2023 >60  >60 mL/min Final   Comment: (NOTE) Calculated using the CKD-EPI Creatinine Equation (2021)    Anion gap 06/12/2023 10  5 - 15 Final   Performed at [facility], [address] [place], [place] [address]   Hgb A1c  MFr Bld 06/12/2023 5.2  4.8 - 5.6 % Final   Comment: (NOTE)         Prediabetes: 5.7 - 6.4          Diabetes: >6.4         Glycemic control for adults with diabetes: <7.0    Mean Plasma Glucose 06/12/2023 103  mg/dL Final   Comment: (NOTE) Performed At: [facility] [address] [place], [place] [id number] Jolene Schimke MD JJ:[id number]    Alcohol, Ethyl (B) 06/12/2023 <10  <10 mg/dL Final   Comment: (NOTE) Lowest detectable limit for serum alcohol is 10 mg/dL.  For medical purposes only. Performed at [facility], [address] [place], [place] [address]    RPR Ser Ql 06/12/2023 NON REACTIVE  NON REACTIVE Final   Performed at [facility], [address] [place], [place] [address]   POC Amphetamine UR 06/12/2023 None Detected  NONE DETECTED (Cut Off Level 1000 ng/mL) Final   POC Secobarbital (BAR) 06/12/2023 None Detected  NONE DETECTED (Cut Off Level 300 ng/mL) Final   POC Buprenorphine (BUP) 06/12/2023 None Detected  NONE DETECTED (Cut Off Level 10 ng/mL) Final   POC Oxazepam (BZO) 06/12/2023 None Detected  NONE DETECTED (Cut Off Level 300 ng/mL) Final   POC Cocaine UR 06/12/2023 None Detected  NONE DETECTED (Cut Off Level 300 ng/mL) Final   POC Methamphetamine UR 06/12/2023 None Detected  NONE DETECTED (Cut Off Level 1000 ng/mL) Final   POC Morphine 06/12/2023 None Detected  NONE DETECTED (Cut Off Level 300 ng/mL) Final   POC Methadone UR 06/12/2023 None Detected  NONE DETECTED (Cut Off Level 300 ng/mL) Final   POC Oxycodone UR 06/12/2023 None Detected  NONE DETECTED (Cut Off Level 100 ng/mL) Final   POC Marijuana UR 06/12/2023 Positive (A)  NONE DETECTED (Cut Off Level 50 ng/mL) Final   Cholesterol 06/12/2023 180  0 - 200 mg/dL Final   Triglycerides 57/32/2025 85  <150 mg/dL Final   HDL 42/70/6237 73  >40 mg/dL Final   Total CHOL/HDL Ratio 06/12/2023 2.5  RATIO Final   VLDL 06/12/2023 17  0 - 40 mg/dL Final   LDL Cholesterol 06/12/2023 90  0 - 99 mg/dL Final   Comment:        Total Cholesterol/HDL:CHD Risk Coronary Heart Disease Risk Table                      Men   Women  1/2 Average Risk   3.4   3.3  Average Risk       5.0   4.4  2 X Average Risk   9.6   7.1  3 X Average Risk  23.4   11.0        Use the calculated Patient Ratio above and the CHD Risk Table to determine the patient's CHD Risk.        ATP III CLASSIFICATION (LDL):  <100     mg/dL   Optimal  628-315  mg/dL   Near or Above                    Optimal  130-159  mg/dL   Borderline  176-160  mg/dL   High  >737     mg/dL   Very High Performed at [facility], [address] [place], [place] [address]    TSH 06/12/2023 1.757  0.350 - 4.500 uIU/mL Final   Comment: Performed by  a 3rd Generation assay with a functional sensitivity of <=0.01 uIU/mL. Performed at [facility], [address] [place], [place] [address]     Allergies: Patient has no known allergies.  Medications:  Facility Ordered Medications  Medication   [COMPLETED] thiamine (VITAMIN B1) injection 100 mg   acetaminophen (TYLENOL) tablet 650 mg   alum & mag hydroxide-simeth (MAALOX/MYLANTA) 200-200-20 MG/5ML suspension 30 mL   magnesium hydroxide (MILK OF MAGNESIA) suspension 30 mL   hydrOXYzine (ATARAX) tablet 25 mg   traZODone (DESYREL) tablet 50 mg   multivitamin with minerals tablet 1 tablet   chlordiazePOXIDE (LIBRIUM) capsule 25 mg   loperamide (IMODIUM) capsule 2-4 mg   ondansetron (ZOFRAN-ODT) disintegrating tablet 4 mg   chlordiazePOXIDE (LIBRIUM) capsule 25 mg   Followed by   chlordiazePOXIDE (LIBRIUM) capsule 25 mg   Followed by   Melene Muller ON 06/14/2023] chlordiazePOXIDE (LIBRIUM) capsule 25 mg   Followed by   Melene Muller ON 06/15/2023] chlordiazePOXIDE (LIBRIUM) capsule 25 mg   thiamine (VITAMIN B1) tablet 100 mg   nicotine (NICODERM CQ - dosed in mg/24 hours) patch 21 mg    Long Term Goals: Improvement in symptoms so as ready for discharge  Short Term Goals: Patient will verbalize feelings in meetings with treatment team members., Patient will attend at least of  50% of the groups daily., Pt will complete the PHQ9 on admission, day 3 and discharge., Patient will participate in completing the [place] Suicide Severity Rating Scale, Patient will score a low risk of violence for 24 hours prior to discharge, and Patient will take medications as prescribed daily.  Medical Decision Making   HPI: Numan Zylstra is a 52 year old male with no significant past psychiatric presented to [facility] on 7/10 requesting alcohol detox.   Patient is a soft-spoken older man who does not appear to be exhibiting symptoms of acute alcohol withdrawal.  After speaking with him, it is clear that alcohol has had a serious effect on the trajectory of his life thus far.  His only periods of sobriety appear to have occurred while he was in active residential substance use treatment.  We discussed what would make this prospective residential treatment post-discharge from [facility] different-he said that he would be interested in taking up with AA after completing long-term substance use treatment.  Will reach out to mother, [facility], whom he lives with, later this afternoon to coordinate additional support, and to contract for safety in the event that he is discharged home.  In the meantime, patient is amenable to starting naltrexone in the setting of normal liver labs.  Currently on day 2 of a Librium taper.  Also noted vivid nightmares last night-likely due to keeping nicotine patch on at night.  Advised patient to remove patch before bedtime.  #Alcohol use disorder, severe -Continue Librium 25 mg taper, currently day 2 (TID) with Librium 25 mg PRNs Q6H for CIWAs >10 -Start Naltrexone 25 mg every day for alcohol cravings. -Continue CIWA protocol -Continue hydroxyzine 25 mg TID PRN -Continue Thiamine supplementation -Encouraged to participate in theraputic milieu.   #Cannabis use disorder -Will further discuss cannabis use throughout stay.   #Tobacco Use Disorder -Nicoderm patch 21 mg made available,  patient reported nightmares after keeping the patch on overnight.   #Dispo -Patient desires long-term residential substance use treatment, social work aware. Estimated LOS 3-5 days.     Recommendations  Based on my evaluation the patient does not appear to have an emergency medical condition.  Tomie China, MD 06/13/23  11:39 AM

## 2023-06-12 NOTE — Group Note (Signed)
Group Topic: Relaxation  Group Date: 06/12/2023 Start Time: 1915 End Time: 2015 Facilitators: Emmit Pomfret D, NT  Department: [facility]  Number of Participants: 6  Group Focus: daily focus, healthy friendships, leisure skills, and social skills Treatment Modality:  Psychoeducation Interventions utilized were mental fitness Purpose: express feelings  Name: COLLYN RIBAS Date of Birth: 01-25-1971  MR: [id number]    Level of Participation: minimal Quality of Participation: cooperative Interactions with others: gave feedback Mood/Affect: appropriate Triggers (if applicable): n/a Cognition: concrete Progress: Significant Response: n/a Plan: follow-up needed  Patients Problems:  Patient Active Problem List   Diagnosis Date Noted   Alcohol dependence (HCC) 06/12/2023

## 2023-06-12 NOTE — Progress Notes (Signed)
   06/11/23 2245  [facility] Triage Screening (Walk-ins at [facility] only)  How Did You Hear About [place]? Legal System  What Is the Reason for Your Visit/Call Today? Pt is 52 yo male who was brought to [facility] voluntarily by [employer] due to pt requesting alcohol detox. Pt reports that his parents called [employer] for him so that he can get help with him drinking alcohol. Pt appears to be intoxicated and is a poor historian. Pt reports that he drinks daily. Initally, pt was unsure of the amount he consumed today but reports "maybe a pint of vodka." Pt reports that before the pandemic he drank a 12 pack of beer easy but is currently drinking 1/5 of vodka daily. Pt denies SI, HI, and AVH. Pt is currently living with his parents. Pt is not active with any outpatient services at this time.  How Long Has This Been Causing You Problems? > than 6 months  Have You Recently Had Any Thoughts About Hurting Yourself? No  Are You Planning to Commit Suicide/Harm Yourself At This time? No  Have you Recently Had Thoughts About Hurting Someone Karolee Ohs? No  Are You Planning To Harm Someone At This Time? No  Explanation: Pt denies HI  Are you currently experiencing any auditory, visual or other hallucinations? No  Have You Used Any Alcohol or Drugs in the Past 24 Hours? Yes  How long ago did you use Drugs or Alcohol? tonight  What Did You Use and How Much? 1 pint of  vodka  Do you have any current medical co-morbidities that require immediate attention? No  Clinician description of patient physical appearance/behavior: Pt appeared intoxicated but was cooperative  What Do You Feel Would Help You the Most Today? Alcohol or Drug Use Treatment  If access to [facility] Urgent Care was not available, would you have sought care in the Emergency Department? No  Determination of Need Urgent (48 hours)  Options For Referral Facility-Based Crisis    Flowsheet Row ED from 06/11/2023 in [facility] ED [facility]  Emergency Department at [facility] ED from 08/30/2021 in [facility] Emergency Department at [facility]  C-SSRS RISK CATEGORY No Risk No Risk No Risk

## 2023-06-12 NOTE — Group Note (Signed)
Group Topic: Core Beliefs  Group Date: 06/12/2023 Start Time: 0200 End Time: 0330 Facilitators: Lenny Pastel  Department: [facility]  Number of Participants: 8  Group Focus: acceptance, activities of daily living skills, chemical dependency issues, clarity of thought, communication, coping skills, daily focus, feeling awareness/expression, forgiveness, healthy friendships, impulsivity, leisure skills, personal responsibility, problem solving, relapse prevention, self-awareness, and self-esteem Treatment Modality:  Cognitive Behavioral Therapy and Solution-Focused Therapy Interventions utilized were exploration, group exercise, mental fitness, problem solving, story telling, and support Purpose: enhance coping skills, explore maladaptive thinking, express feelings, express irrational fears, improve communication skills, increase insight, regain self-worth, reinforce self-care, relapse prevention strategies, and trigger / craving management  Name: Timothy Estrada Date of Birth: Aug 01, 1971  MR: [id number]    Level of Participation: active Quality of Participation: attentive, cooperative, engaged, offered feedback, and supportive Interactions with others: gave feedback Mood/Affect: appropriate Triggers (if applicable): Anxiety Cognition: coherent/clear, goal directed, insightful, and logical Progress: Gaining insight Summary of Progress/Problems: Patient actively participated in group on today. Group started off with introductions and group rules. Group members participated in a therapeutic activity that required active listening and communication skills. Group members were able to identify similarities and differences within the group. Patient interacted positively with staff and peers. Patient reports he plans to mend broken relationships and likely find another job as he has made connects with coworkers who all enjoy sitting around and drinking. Patient reports  he wants to make a change in his life and he believes it starts with those two things. Patient was receptive to the feedback provided. No issues to report.  Plan: referral / recommendations  Patients Problems:  Patient Active Problem List   Diagnosis Date Noted   Alcohol dependence (HCC) 06/12/2023

## 2023-06-12 NOTE — Progress Notes (Signed)
   06/12/23 0653  [facility] Triage Screening (Walk-ins at [facility] only)  How Did You Hear About [place]? Self  What Is the Reason for Your Visit/Call Today? Timothy Estrada is a 52 year old male presenting as a voluntary walk-in to [facility] due to alcohol detox. Patient denied SI, HI and psychosis. Patient was seen a few hours [facility] and recommended for observation, however patient left AMA. Patient reports that his parents called [employer] for him so that he can get help with him drinking alcohol earlier tonight. Patient is poor historian. Patient reports drinking "maybe a pint of vodka" last night. Patient reports drinking approx 1/5 of vodka daily. Patient currently resides with his parents.  How Long Has This Been Causing You Problems? > than 6 months  Have You Recently Had Any Thoughts About Hurting Yourself? No  Are You Planning to Commit Suicide/Harm Yourself At This time? No  Have you Recently Had Thoughts About Hurting Someone Karolee Ohs? No  Are You Planning To Harm Someone At This Time? No  Explanation: n/a  Are you currently experiencing any auditory, visual or other hallucinations? No  Have You Used Any Alcohol or Drugs in the Past 24 Hours? Yes  How long ago did you use Drugs or Alcohol? alcohol  What Did You Use and How Much? 1 pint of Vodka  Do you have any current medical co-morbidities that require immediate attention? No  Clinician description of patient physical appearance/behavior: neat / cooperative  What Do You Feel Would Help You the Most Today? Alcohol or Drug Use Treatment  If access to [facility] Urgent Care was not available, would you have sought care in the Emergency Department? No  Determination of Need Urgent (48 hours)  Options For Referral Medication Management;Outpatient Therapy;Chemical Dependency Intensive Outpatient Therapy (CDIOP);Facility-Based Crisis;BH Urgent Care    Flowsheet Row ED from 06/12/2023 in [facility] ED from 06/11/2023 in [facility] ED from 04/20/2023 in [facility] Emergency Department at [facility]  C-SSRS RISK CATEGORY No Risk No Risk No Risk

## 2023-06-12 NOTE — Discharge Instructions (Signed)
Transfer to [facility] 

## 2023-06-12 NOTE — ED Notes (Signed)
Pt a/o x 4. Denies SI/HI/AVH. Denies s/s of withdrawal. Pleasant and engaged. No noted distress. Will continue to monitor for safety. Pt to join group in courtyard

## 2023-06-12 NOTE — ED Notes (Addendum)
Pt transferred from observation unit to [facility] requesting ETOH detox. Pt reports last drink was yesterday. Pt states, "It's time to quit. I'm tired". Support and encouragement provided. Pt denies SI/HI/AVH. Calm, cooperative throughout interview process. Skin assessment completed. Oriented to unit. Meal and drink offered. . Pt verbally contract for safety. Will monitor for safety.

## 2023-06-12 NOTE — ED Provider Notes (Signed)
Nursing staff inform writer that patient requested to leave AMA

## 2023-06-12 NOTE — ED Notes (Signed)
Patient was provided with muffin and juice for breakfast

## 2023-06-12 NOTE — ED Notes (Signed)
Pt sleeping in no acute distress. RR even and unlabored. Environment secured. Will continue to monitor for safety. 

## 2023-06-12 NOTE — ED Notes (Signed)
Pt observed/assessed in room sleeping. RR even and unlabored, appearing in no noted distress. Environmental check complete, will continue to monitor for safety 

## 2023-06-12 NOTE — ED Provider Notes (Addendum)
Behavioral Health Urgent Care Medical Screening Exam  Date: 06/12/23 Patient Name: Timothy Estrada MRN: [id number] Chief Complaint: alcohol detox  Diagnoses:  Final diagnoses:  Uncomplicated alcohol dependence (HCC)    HPI: Timothy Estrada is a 51 year old male patient with no significant past psychiatric history who presents to the [facility] voluntary unaccompanied requesting alcohol detox.  Patient seen and evaluated face-to-face by this provider, and chart reviewed. Per chart review, the patient was evaluated and admitted to the [facility] on 06/11/23 pending [facility]BC placement, however, patient requested to leave AMA.  On evaluation, patient is alert and oriented x 4. His thought process is linear and goal oriented. He denies SI/HI/AVH. No psychosis observed on exam. His speech is clear and coherent at a moderate tone. His mood is euthymic and affect is congruent. He has fair eye contact. He appears casually dressed. He is calm and cooperative and does not appear to be in acute distress. Patient states that he does not know why he requested to leave last night but realized he needs to get help because he is tired of drinking every day. He reports drinking 30+ years. He states that he started drinking vodka more since the pandemic. He reports on average drinking a pint to 1/5 of vodka per day, and last reported drink was yesterday. He denies active withdrawal at this time. He reports the longest period he's been without alcohol was for 6-7 months five or six years ago. He denies past substance abuse treatments. He denies a past history of alcohol withdrawal, seizures, or delirium tremens. He is noted to be mildly hypertensive on evaluation. He denies cardiac history or acute symptoms at this time. Patient's elevated blood pressure may be in regards to alcohol withdrawal. Will start Ativan 1 mg detox protocol pending labs. He denies using illicit drugs. He denies depressive  symptoms, mania or anxiety. He reports fair sleep. He reports a fair appetite.    Total Time spent with patient: 30 minutes  Musculoskeletal  Strength & Muscle Tone: within normal limits Gait & Station: normal Patient leans: N/A  Psychiatric Specialty Exam  Presentation General Appearance:  Appropriate for Environment  Eye Contact: Fair  Speech: Clear and Coherent  Speech Volume: Normal  Handedness: Right   Mood and Affect  Mood: Euthymic  Affect: Congruent   Thought Process  Thought Processes: Coherent  Descriptions of Associations:Intact  Orientation:Full (Time, Place and Person)  Thought Content:Logical    Hallucinations:Hallucinations: None  Ideas of Reference:None  Suicidal Thoughts:Suicidal Thoughts: No  Homicidal Thoughts:Homicidal Thoughts: No   Sensorium  Memory: Immediate Fair; Recent Fair; Remote Fair  Judgment: Fair  Insight: Fair   Art therapist  Concentration: Fair  Attention Span: Fair  Recall: [employer] of Knowledge: Fair  Language: Fair   Psychomotor Activity  Psychomotor Activity: Psychomotor Activity: Normal   Assets  Assets: Communication Skills; Desire for Improvement; Leisure Time; Physical Health; Financial Resources/Insurance; Social Support; Housing   Sleep  Sleep: Sleep: Fair Number of Hours of Sleep: 8   Nutritional Assessment (For OBS and FBC admissions only) Has the patient had a weight loss or gain of 10 pounds or more in the last 3 months?: No Has the patient had a decrease in food intake/or appetite?: No Does the patient have dental problems?: No Does the patient have eating habits or behaviors that may be indicators of an eating disorder including binging or inducing vomiting?: No Has the patient recently lost weight without trying?: 0  Has the patient been eating poorly because of a decreased appetite?: 0 Malnutrition Screening Tool Score: 0    Physical Exam HENT:      Head: Normocephalic.     Nose: Nose normal.  Eyes:     Conjunctiva/sclera: Conjunctivae normal.  Cardiovascular:     Rate and Rhythm: Normal rate.  Pulmonary:     Effort: Pulmonary effort is normal.  Musculoskeletal:        General: Normal range of motion.     Cervical back: Normal range of motion.  Neurological:     Mental Status: He is alert and oriented to person, place, and time.    Review of Systems  Constitutional: Negative.   HENT: Negative.    Eyes: Negative.   Respiratory: Negative.    Cardiovascular: Negative.   Gastrointestinal: Negative.   Genitourinary: Negative.   Musculoskeletal: Negative.   Neurological: Negative.   Endo/Heme/Allergies: Negative.     Blood pressure (!) 151/98, pulse 67, temperature 98.3 F (36.8 C), temperature source Oral, resp. rate 18, SpO2 96%. There is no height or weight on file to calculate BMI.  Past Psychiatric History: No significant psychiatric history. No past inpatient or outpatient psychiatric treatments. No past suicide attempts.   Is the patient at risk to self? No Has the patient been a risk to self in the past 6 months? No .    Has the patient been a risk to self within the distant past? No   Is the patient a risk to others? No   Has the patient been a risk to others in the past 6 months? No   Has the patient been a risk to others within the distant past? No   Past Medical History: None. Patient denies taking prescribed or OTC medications.   Family History: Brother and father history alcoholism   Social History: Patient resides with his parents, mother and father. Patient denies access to firearms in the home. 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 Patient denies using illicit drugs.   Last Labs:  No visits with results within 6 Month(s) from this visit.  Latest known visit with results is:  Admission on 08/30/2021, Discharged on 08/30/2021  Component Date Value Ref Range Status   WBC 08/30/2021 12.8 (H)  4.0  - 10.5 K/uL Final   RBC 08/30/2021 4.95  4.22 - 5.81 MIL/uL Final   Hemoglobin 08/30/2021 15.2  13.0 - 17.0 g/dL Final   HCT 16/09/9603 47.4  39.0 - 52.0 % Final   MCV 08/30/2021 95.8  80.0 - 100.0 fL Final   MCH 08/30/2021 30.7  26.0 - 34.0 pg Final   MCHC 08/30/2021 32.1  30.0 - 36.0 g/dL Final   RDW 54/08/8118 13.3  11.5 - 15.5 % Final   Platelets 08/30/2021 313  150 - 400 K/uL Final   nRBC 08/30/2021 0.0  0.0 - 0.2 % Final   Neutrophils Relative % 08/30/2021 68  % Final   Neutro Abs 08/30/2021 8.9 (H)  1.7 - 7.7 K/uL Final   Lymphocytes Relative 08/30/2021 18  % Final   Lymphs Abs 08/30/2021 2.4  0.7 - 4.0 K/uL Final   Monocytes Relative 08/30/2021 10  % Final   Monocytes Absolute 08/30/2021 1.3 (H)  0.1 - 1.0 K/uL Final   Eosinophils Relative 08/30/2021 2  % Final   Eosinophils Absolute 08/30/2021 0.2  0.0 - 0.5 K/uL Final   Basophils Relative 08/30/2021 1  % Final   Basophils Absolute 08/30/2021 0.1  0.0 -  0.1 K/uL Final   Immature Granulocytes 08/30/2021 1  % Final   Abs Immature Granulocytes 08/30/2021 0.06  0.00 - 0.07 K/uL Final   Performed at [facility], [address], [place], [place] [address]   Sodium 08/30/2021 138  135 - 145 mmol/L Final   Potassium 08/30/2021 4.4  3.5 - 5.1 mmol/L Final   Chloride 08/30/2021 102  98 - 111 mmol/L Final   CO2 08/30/2021 23  22 - 32 mmol/L Final   Glucose, Bld 08/30/2021 103 (H)  70 - 99 mg/dL Final   Glucose reference range applies only to samples taken after fasting for at least 8 hours.   BUN 08/30/2021 13  6 - 20 mg/dL Final   Creatinine, Ser 08/30/2021 0.96  0.61 - 1.24 mg/dL Final   Calcium 60/45/4098 9.9  8.9 - 10.3 mg/dL Final   Total Protein 11/91/4782 7.5  6.5 - 8.1 g/dL Final   Albumin 95/62/1308 4.2  3.5 - 5.0 g/dL Final   AST 65/78/4696 25  15 - 41 U/L Final   ALT 08/30/2021 24  0 - 44 U/L Final   Alkaline Phosphatase 08/30/2021 78  38 - 126 U/L Final   Total Bilirubin 08/30/2021 0.8  0.3 - 1.2 mg/dL Final    GFR, Estimated 08/30/2021 >60  >60 mL/min Final   Comment: (NOTE) Calculated using the CKD-EPI Creatinine Equation (2021)    Anion gap 08/30/2021 13  5 - 15 Final   Performed at [facility], [address] [place], [place] [address]   Sodium 08/30/2021 138  135 - 145 mmol/L Final   Potassium 08/30/2021 4.3  3.5 - 5.1 mmol/L Final   Chloride 08/30/2021 103  98 - 111 mmol/L Final   BUN 08/30/2021 16  6 - 20 mg/dL Final   Creatinine, Ser 08/30/2021 1.00  0.61 - 1.24 mg/dL Final   Glucose, Bld 41/32/4401 104 (H)  70 - 99 mg/dL Final   Glucose reference range applies only to samples taken after fasting for at least 8 hours.   Calcium, Ion 08/30/2021 1.18  1.15 - 1.40 mmol/L Final   TCO2 08/30/2021 25  22 - 32 mmol/L Final   Hemoglobin 08/30/2021 16.0  13.0 - 17.0 g/dL Final   HCT 02/72/5366 47.0  39.0 - 52.0 % Final   Color, Urine 08/30/2021 YELLOW  YELLOW Final   APPearance 08/30/2021 CLEAR  CLEAR Final   Specific Gravity, Urine 08/30/2021 1.028  1.005 - 1.030 Final   pH 08/30/2021 5.0  5.0 - 8.0 Final   Glucose, UA 08/30/2021 NEGATIVE  NEGATIVE mg/dL Final   Hgb urine dipstick 08/30/2021 NEGATIVE  NEGATIVE Final   Bilirubin Urine 08/30/2021 NEGATIVE  NEGATIVE Final   Ketones, ur 08/30/2021 NEGATIVE  NEGATIVE mg/dL Final   Protein, ur 44/01/4741 NEGATIVE  NEGATIVE mg/dL Final   Nitrite 59/56/3875 NEGATIVE  NEGATIVE Final   Leukocytes,Ua 08/30/2021 NEGATIVE  NEGATIVE Final   Performed at [facility], [address], [place], [place] [address]    Allergies: Patient has no known allergies.  Medications:  Facility Ordered Medications  Medication   acetaminophen (TYLENOL) tablet 650 mg   alum & mag hydroxide-simeth (MAALOX/MYLANTA) 200-200-20 MG/5ML suspension 30 mL   magnesium hydroxide (MILK OF MAGNESIA) suspension 30 mL   hydrOXYzine (ATARAX) tablet 25 mg   traZODone (DESYREL) tablet 50 mg   thiamine (VITAMIN B1) injection 100 mg   [START ON 06/13/2023] thiamine  (VITAMIN B1) tablet 100 mg   multivitamin with  minerals tablet 1 tablet   LORazepam (ATIVAN) tablet 1 mg   loperamide (IMODIUM) capsule 2-4 mg   ondansetron (ZOFRAN-ODT) disintegrating tablet 4 mg   LORazepam (ATIVAN) tablet 1 mg   Followed by   Melene Muller ON 06/13/2023] LORazepam (ATIVAN) tablet 1 mg   Followed by   Melene Muller ON 06/14/2023] LORazepam (ATIVAN) tablet 1 mg   Followed by   Melene Muller ON 06/15/2023] LORazepam (ATIVAN) tablet 1 mg   PTA Medications  Medication Sig   acetaminophen (TYLENOL) 500 MG tablet Take 1,000 mg by mouth daily as needed for headache.   Lidocaine (HM LIDOCAINE PATCH) 4 % PTCH Apply 1 patch topically every 12 (twelve) hours as needed.      Medical Decision Making  Patient admitted to the [facility] continuous assessment unit pending FBC bed availability today for alcohol detox. Patient is voluntary. Patient is interested in long-term residential substance abuse treatment after completing alcohol detox.    Labs Lab Orders         CBC with Differential/Platelet         Comprehensive metabolic panel         Hemoglobin A1c         Ethanol         Lipid panel         TSH         RPR         HIV Antibody (routine testing w rflx)         POCT Urine Drug Screen - (I-Screen)    EKG  Current medication regimen for alcohol use detox  LORazepam  1 mg Oral QID   Followed by   Melene Muller ON 06/13/2023] LORazepam  1 mg Oral TID   Followed by   Melene Muller ON 06/14/2023] LORazepam  1 mg Oral BID   Followed by   Melene Muller ON 06/15/2023] LORazepam  1 mg Oral Daily   multivitamin with minerals  1 tablet Oral Daily   thiamine  100 mg Intramuscular Once   [START ON 06/13/2023] thiamine  100 mg Oral Daily   PRN medications acetaminophen, alum & mag hydroxide-simeth, hydrOXYzine, loperamide, LORazepam, magnesium hydroxide, ondansetron, traZODone   Recommendations  Based on my evaluation the patient does not appear to have an emergency medical  condition.  Raj Landress L, NP 06/12/23  8:02 AM

## 2023-06-13 ENCOUNTER — Encounter (HOSPITAL_COMMUNITY): Payer: Self-pay | Admitting: Behavioral Health

## 2023-06-13 DIAGNOSIS — F17210 Nicotine dependence, cigarettes, uncomplicated: Secondary | ICD-10-CM | POA: Diagnosis not present

## 2023-06-13 DIAGNOSIS — F1210 Cannabis abuse, uncomplicated: Secondary | ICD-10-CM | POA: Diagnosis not present

## 2023-06-13 DIAGNOSIS — F1020 Alcohol dependence, uncomplicated: Secondary | ICD-10-CM | POA: Diagnosis not present

## 2023-06-13 LAB — HEMOGLOBIN A1C
Hgb A1c MFr Bld: 5.2 % (ref 4.8–5.6)
Mean Plasma Glucose: 103 mg/dL

## 2023-06-13 MED ORDER — NALTREXONE HCL 50 MG PO TABS
25.0000 mg | ORAL_TABLET | Freq: Once | ORAL | Status: DC
Start: 2023-06-13 — End: 2023-06-13

## 2023-06-13 MED ORDER — NALTREXONE HCL 50 MG PO TABS
25.0000 mg | ORAL_TABLET | Freq: Every day | ORAL | Status: DC
  Administered 2023-06-13 – 2023-06-16 (×4): 25 mg via ORAL
  Filled 2023-06-13 (×4): qty 1

## 2023-06-13 NOTE — Group Note (Signed)
Group Topic: Change and Accountability  Group Date: 06/13/2023 Start Time: 1942 End Time: 2044 Facilitators: Rae Lips B  Department: [facility]  Number of Participants: 7  Group Focus: acceptance and activities of daily living skills Treatment Modality:  Individual Therapy Interventions utilized were leisure development Purpose: enhance coping skills  Name: Timothy Estrada Date of Birth: 1971/01/16  MR: [id number]    Level of Participation: active Quality of Participation: attentive Interactions with others: gave feedback Mood/Affect: appropriate Triggers (if applicable): NA Cognition: coherent/clear Progress: Gaining insight Response: NA Plan: patient will be encouraged to keep going to groups.  Patients Problems:  Patient Active Problem List   Diagnosis Date Noted   Alcohol dependence (HCC) 06/12/2023

## 2023-06-13 NOTE — Progress Notes (Signed)
Pt is awake, alert and oriented X4. Pt did not voice any complaints of pain or discomfort. No distress noted. Administered scheduled meds with no issue. Pt denies current SI/HI/AVH, plan or intent. Staff will monitor for pt's safety. 

## 2023-06-13 NOTE — ED Notes (Signed)
Pt resting at this hour. No apparent distress. RR even and unlabored. Monitored for safety.  

## 2023-06-13 NOTE — Group Note (Signed)
Group Topic: Recovery Basics  Group Date: 06/13/2023 Start Time: 1000 End Time: 1100 Facilitators: Londell Moh, NT  Department: [facility]  Number of Participants: 7  Group Focus: goals/reality orientation Treatment Modality:  Psychoeducation Interventions utilized were patient education Purpose: increase insight  Name: Timothy Estrada Date of Birth: 1971-09-13  MR: [id number]    Level of Participation: Pt did not attend group Patients Problems:  Patient Active Problem List   Diagnosis Date Noted   Alcohol dependence (HCC) 06/12/2023

## 2023-06-13 NOTE — Progress Notes (Signed)
Pt's CIWA was 0. 

## 2023-06-13 NOTE — Progress Notes (Signed)
Pt is asleep. Respirations are even and unlabored. No signs of acute distress noted. Staff will monitor for pt's safety. 

## 2023-06-13 NOTE — ED Notes (Signed)
Pt was given breakfast

## 2023-06-13 NOTE — ED Notes (Signed)
Pt was provided with dinner 

## 2023-06-13 NOTE — ED Notes (Signed)
Pt in dayroom at this hour. No apparent distress. RR even and unlabored. Monitored for safety.  

## 2023-06-13 NOTE — ED Notes (Signed)
Progress note   D: Pt seen at nurse's station. Pt denies SI, HI, AVH. Contracts for safety. Pt rates pain  0/10. Pt rates anxiety  0/10 and depression  0/10. Pt says the good thing about his day is being active in the milieu and going to the AA meeting this evening. Pt states he has vivid dreams last night that interrupted his sleep. "I couldn't believe the stuff I was dreaming. The doctor told me it was probably because of the nicotine patch. I'm gonna take that off tonight."  No other concerns noted at this time.  A: Pt provided support and encouragement. Pt given scheduled medication as prescribed. PRNs as appropriate. Q15 min checks for safety.   R: Pt safe on the unit. Will continue to monitor.

## 2023-06-13 NOTE — Tx Team (Signed)
LCSW, MD, and Resident met with patient to assess current mood, affect, physical state, and inquire about needs/goals while here in [facility] and after discharge. Patient reports he presented due to needing help with detox and getting into a residential program. Patient reports he has been struggling with alcohol use for majority of his life. Patient reports on average he drinks about a 5th of liquor a day. Patient denies experiencing any seizures withdrawals. Patient reports he has never sought for his addiction, however reports he is at the point of wanting to make a change in his life. Patient reports he has currently been living back and forth between his parents house and sisters house. Patient reports he is currently working, however does not have any insurance at this time. 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 Patient reports at this time he wants to get further treatment for himself, and his goal is to go into a 21-30 day program. Patient has provided permission for LCSW to send his clinical information out for review. Patient aware that LCSW will send referrals out and will follow up to provide updates as received. Patient expressed understanding and appreciation of LCSW assistance. No other needs were reported at this time by patient.   Referral has been sent to [facility] and [facility] for review. Patient reports he would also consider [facility] placement and [facility] as a secondary option if residential does not work out. LCSW will continue to follow and provide support to patient while on [facility] unit.   Fernande Boyden, LCSW Clinical Social Worker [facility] Ph: [phone]

## 2023-06-13 NOTE — ED Notes (Signed)
Pt observed/assessed in room sleeping. RR even and unlabored, appearing in no noted distress. Environmental check complete, will continue to monitor for safety 

## 2023-06-14 DIAGNOSIS — F1020 Alcohol dependence, uncomplicated: Secondary | ICD-10-CM | POA: Diagnosis not present

## 2023-06-14 DIAGNOSIS — F17210 Nicotine dependence, cigarettes, uncomplicated: Secondary | ICD-10-CM | POA: Diagnosis not present

## 2023-06-14 DIAGNOSIS — F1210 Cannabis abuse, uncomplicated: Secondary | ICD-10-CM | POA: Diagnosis not present

## 2023-06-14 MED ORDER — NICOTINE POLACRILEX 2 MG MT GUM
2.0000 mg | CHEWING_GUM | OROMUCOSAL | Status: DC | PRN
  Administered 2023-06-14 (×2): 2 mg via ORAL
  Filled 2023-06-14: qty 1

## 2023-06-14 NOTE — ED Notes (Signed)
Pt is in the dayroom watching TV with peers whiles coloring some art works. Pt denies SI/HI/AVH. Pt is pleasant,no acute distress noted. Will continue to monitor for safety.

## 2023-06-14 NOTE — ED Notes (Signed)
Pt sleeping in no acute distress. RR even and unlabored. Environment secured. Will continue to monitor for safety. 

## 2023-06-14 NOTE — ED Provider Notes (Addendum)
Facility Based Crisis Progress Note  Date: 06/14/23 Patient Name: Timothy Estrada MRN: [id number] Chief Complaint: Alcohol detox  Diagnoses:  Final diagnoses:  Alcohol use disorder, severe, dependence (HCC)    Subjective: Timothy Estrada is a 52 year old male with no significant past psychiatric presented to [facility] on 7/10 requesting alcohol detox.  Per chart review, patient has been drinking for many decades, but alcohol use has increased significantly since ~2020 up to 1 pint/one fifth of vodka daily.  Also endorses marijuana and tobacco use.   On interview, patient states he is doing "all right".  He denies SI/HI/AVH.  He denies significant alcohol withdrawal at this time.  PHQ 2-9:   Flowsheet Row ED from 06/12/2023 in [facility] Most recent reading at 06/12/2023  2:35 PM ED from 06/12/2023 in [facility] Most recent reading at 06/12/2023  8:32 AM ED from 06/11/2023 in [facility] Most recent reading at 06/11/2023 11:07 PM  C-SSRS RISK CATEGORY No Risk No Risk No Risk       Screenings    Flowsheet Row Most Recent Value  CIWA-Ar Total 0       Total Time spent with patient: 30 minutes  Musculoskeletal  Strength & Muscle Tone: within normal limits Gait & Station: normal Patient leans: N/A  Psychiatric Specialty Exam  Presentation General Appearance:  Appropriate for Environment; Casual  Eye Contact: Fair  Speech: Garbled  Speech Volume: Normal  Handedness: Right   Mood and Affect  Mood: Euthymic  Affect: Congruent   Thought Process  Thought Processes: Coherent  Descriptions of Associations:Intact  Orientation:Full (Time, Place and Person)  Thought Content:Logical    Hallucinations:Hallucinations: None  Ideas of Reference:None  Suicidal Thoughts:Suicidal Thoughts: No  Homicidal Thoughts:Homicidal Thoughts: No   Sensorium  Memory: Immediate Fair; Recent Poor;  Remote Fair  Judgment: Fair  Insight: Fair   Chartered certified accountant: Fair  Attention Span: Fair  Recall: [employer] of Knowledge: Fair  Language: Fair   Psychomotor Activity  Psychomotor Activity: Psychomotor Activity: Normal   Assets  Assets: Communication Skills; Desire for Improvement; Leisure Time; Physical Health; Financial Resources/Insurance; Social Support; Housing   Sleep  Sleep: Sleep: Fair   No data recorded   Physical Exam Vitals reviewed.  Constitutional:      General: He is not in acute distress.    Appearance: He is normal weight.  HENT:     Head: Normocephalic and atraumatic.     Mouth/Throat:     Mouth: Mucous membranes are dry.  Eyes:     Extraocular Movements: Extraocular movements intact.  Pulmonary:     Effort: Pulmonary effort is normal.  Abdominal:     General: Abdomen is flat.  Musculoskeletal:        General: Normal range of motion.     Cervical back: Normal range of motion.  Neurological:     General: No focal deficit present.     Mental Status: He is alert. Mental status is at baseline.  Psychiatric:        Mood and Affect: Mood normal.        Behavior: Behavior normal.    Review of Systems  Psychiatric/Behavioral:  Positive for substance abuse. Negative for hallucinations and suicidal ideas. The patient has insomnia. The patient is not nervous/anxious.     Blood pressure (!) 160/99, pulse 61, temperature 98.3 F (36.8 C), temperature source Oral, resp. rate 18, SpO2 100%. There is no height or  weight on file to calculate BMI.  Past Psychiatric History: Patient does not have any previous psychiatric diagnoses.  No previous medication trials.  Has never seen a psychiatrist or needed inpatient services for psychiatric reasons.  Has engaged with inpatient substance use treatment of various kinds over the years, to little effect.  Is the patient at risk to self? No  Has the patient been a risk to self  in the past 6 months? No .    Has the patient been a risk to self within the distant past? No   Is the patient a risk to others? No   Has the patient been a risk to others in the past 6 months? No   Has the patient been a risk to others within the distant past? No   Past Medical History: Denies relevant past medical history. Family History: Denies past family medical history.  EtOH history in mother, brother, father. Social History: Smokes 1 pack of cigarettes a day.  Last Labs:  Admission on 06/12/2023, Discharged on 06/12/2023  Component Date Value Ref Range Status   WBC 06/12/2023 6.3  4.0 - 10.5 K/uL Final   RBC 06/12/2023 4.40  4.22 - 5.81 MIL/uL Final   Hemoglobin 06/12/2023 13.8  13.0 - 17.0 g/dL Final   HCT 16/09/9603 41.7  39.0 - 52.0 % Final   MCV 06/12/2023 94.8  80.0 - 100.0 fL Final   MCH 06/12/2023 31.4  26.0 - 34.0 pg Final   MCHC 06/12/2023 33.1  30.0 - 36.0 g/dL Final   RDW 54/08/8118 12.8  11.5 - 15.5 % Final   Platelets 06/12/2023 234  150 - 400 K/uL Final   nRBC 06/12/2023 0.0  0.0 - 0.2 % Final   Neutrophils Relative % 06/12/2023 45  % Final   Neutro Abs 06/12/2023 2.9  1.7 - 7.7 K/uL Final   Lymphocytes Relative 06/12/2023 36  % Final   Lymphs Abs 06/12/2023 2.3  0.7 - 4.0 K/uL Final   Monocytes Relative 06/12/2023 13  % Final   Monocytes Absolute 06/12/2023 0.8  0.1 - 1.0 K/uL Final   Eosinophils Relative 06/12/2023 5  % Final   Eosinophils Absolute 06/12/2023 0.3  0.0 - 0.5 K/uL Final   Basophils Relative 06/12/2023 1  % Final   Basophils Absolute 06/12/2023 0.0  0.0 - 0.1 K/uL Final   Immature Granulocytes 06/12/2023 0  % Final   Abs Immature Granulocytes 06/12/2023 0.01  0.00 - 0.07 K/uL Final   Performed at [facility], [address], [place], [place] [address]   Sodium 06/12/2023 138  135 - 145 mmol/L Final   Potassium 06/12/2023 4.2  3.5 - 5.1 mmol/L Final   Chloride 06/12/2023 103  98 - 111 mmol/L Final   CO2 06/12/2023 25  22 - 32 mmol/L  Final   Glucose, Bld 06/12/2023 85  70 - 99 mg/dL Final   Glucose reference range applies only to samples taken after fasting for at least 8 hours.   BUN 06/12/2023 15  6 - 20 mg/dL Final   Creatinine, Ser 06/12/2023 0.92  0.61 - 1.24 mg/dL Final   Calcium 95/62/1308 9.0  8.9 - 10.3 mg/dL Final   Total Protein 65/78/4696 7.0  6.5 - 8.1 g/dL Final   Albumin 29/52/8413 3.8  3.5 - 5.0 g/dL Final   AST 24/40/1027 26  15 - 41 U/L Final   ALT 06/12/2023 30  0 - 44 U/L Final   Alkaline Phosphatase 06/12/2023 62  38 -  126 U/L Final   Total Bilirubin 06/12/2023 0.2 (L)  0.3 - 1.2 mg/dL Final   GFR, Estimated 06/12/2023 >60  >60 mL/min Final   Comment: (NOTE) Calculated using the CKD-EPI Creatinine Equation (2021)    Anion gap 06/12/2023 10  5 - 15 Final   Performed at [facility], [address], [place], [place] [address]   Hgb A1c MFr Bld 06/12/2023 5.2  4.8 - 5.6 % Final   Comment: (NOTE)         Prediabetes: 5.7 - 6.4         Diabetes: >6.4         Glycemic control for adults with diabetes: <7.0    Mean Plasma Glucose 06/12/2023 103  mg/dL Final   Comment: (NOTE) Performed At: [facility] [address] [place], [place] [id number] Jolene Schimke MD QI:[id number]    Alcohol, Ethyl (B) 06/12/2023 <10  <10 mg/dL Final   Comment: (NOTE) Lowest detectable limit for serum alcohol is 10 mg/dL.  For medical purposes only. Performed at [facility], [address] [place], [place] [address]    RPR Ser Ql 06/12/2023 NON REACTIVE  NON REACTIVE Final   Performed at [facility], [address], [place], [place] [address]   POC Amphetamine UR 06/12/2023 None Detected  NONE DETECTED (Cut Off Level 1000 ng/mL) Final   POC Secobarbital (BAR) 06/12/2023 None Detected  NONE DETECTED (Cut Off Level 300 ng/mL) Final   POC Buprenorphine (BUP) 06/12/2023 None Detected  NONE DETECTED (Cut Off Level 10 ng/mL) Final   POC Oxazepam (BZO) 06/12/2023 None Detected  NONE  DETECTED (Cut Off Level 300 ng/mL) Final   POC Cocaine UR 06/12/2023 None Detected  NONE DETECTED (Cut Off Level 300 ng/mL) Final   POC Methamphetamine UR 06/12/2023 None Detected  NONE DETECTED (Cut Off Level 1000 ng/mL) Final   POC Morphine 06/12/2023 None Detected  NONE DETECTED (Cut Off Level 300 ng/mL) Final   POC Methadone UR 06/12/2023 None Detected  NONE DETECTED (Cut Off Level 300 ng/mL) Final   POC Oxycodone UR 06/12/2023 None Detected  NONE DETECTED (Cut Off Level 100 ng/mL) Final   POC Marijuana UR 06/12/2023 Positive (A)  NONE DETECTED (Cut Off Level 50 ng/mL) Final   Cholesterol 06/12/2023 180  0 - 200 mg/dL Final   Triglycerides 18/84/1660 85  <150 mg/dL Final   HDL 63/12/6008 73  >40 mg/dL Final   Total CHOL/HDL Ratio 06/12/2023 2.5  RATIO Final   VLDL 06/12/2023 17  0 - 40 mg/dL Final   LDL Cholesterol 06/12/2023 90  0 - 99 mg/dL Final   Comment:        Total Cholesterol/HDL:CHD Risk Coronary Heart Disease Risk Table                     Men   Women  1/2 Average Risk   3.4   3.3  Average Risk       5.0   4.4  2 X Average Risk   9.6   7.1  3 X Average Risk  23.4   11.0        Use the calculated Patient Ratio above and the CHD Risk Table to determine the patient's CHD Risk.        ATP III CLASSIFICATION (LDL):  <100     mg/dL   Optimal  932-355  mg/dL   Near or Above  Optimal  130-159  mg/dL   Borderline  540-981  mg/dL   High  >191     mg/dL   Very High Performed at [facility], [address], [place], [place] [address]    TSH 06/12/2023 1.757  0.350 - 4.500 uIU/mL Final   Comment: Performed by a 3rd Generation assay with a functional sensitivity of <=0.01 uIU/mL. Performed at [facility], [address] [place], [place] [address]     Allergies: Patient has no known allergies.  Medications:  Facility Ordered Medications  Medication   [COMPLETED] thiamine (VITAMIN B1) injection 100 mg   acetaminophen (TYLENOL) tablet 650  mg   alum & mag hydroxide-simeth (MAALOX/MYLANTA) 200-200-20 MG/5ML suspension 30 mL   magnesium hydroxide (MILK OF MAGNESIA) suspension 30 mL   hydrOXYzine (ATARAX) tablet 25 mg   traZODone (DESYREL) tablet 50 mg   multivitamin with minerals tablet 1 tablet   chlordiazePOXIDE (LIBRIUM) capsule 25 mg   loperamide (IMODIUM) capsule 2-4 mg   ondansetron (ZOFRAN-ODT) disintegrating tablet 4 mg   [EXPIRED] chlordiazePOXIDE (LIBRIUM) capsule 25 mg   Followed by   chlordiazePOXIDE (LIBRIUM) capsule 25 mg   Followed by   chlordiazePOXIDE (LIBRIUM) capsule 25 mg   Followed by   Melene Muller ON 06/15/2023] chlordiazePOXIDE (LIBRIUM) capsule 25 mg   thiamine (VITAMIN B1) tablet 100 mg   [COMPLETED] nicotine (NICODERM CQ - dosed in mg/24 hours) patch 21 mg   naltrexone (DEPADE) tablet 25 mg   nicotine polacrilex (NICORETTE) gum 2 mg    Long Term Goals: Improvement in symptoms so as ready for discharge  Short Term Goals: Patient will verbalize feelings in meetings with treatment team members., Patient will attend at least of 50% of the groups daily., Pt will complete the PHQ9 on admission, day 3 and discharge., Patient will participate in completing the [place] Suicide Severity Rating Scale, Patient will score a low risk of violence for 24 hours prior to discharge, and Patient will take medications as prescribed daily.  Medical Decision Making   HPI: Nester Wiencek is a 52 year old male with no significant past psychiatric presented to [facility] on 7/10 requesting alcohol detox.   Patient is a soft-spoken older man who does not appear to be exhibiting symptoms of acute alcohol withdrawal.  After speaking with him, it is clear that alcohol has had a serious effect on the trajectory of his life thus far.  His only periods of sobriety appear to have occurred while he was in active residential substance use treatment.  We discussed what would make this prospective residential treatment post-discharge from [facility]  different-he said that he would be interested in taking up with AA after completing long-term substance use treatment.  Will reach out to mother, [facility], whom he lives with, later this afternoon to coordinate additional support, and to contract for safety in the event that he is discharged home.  In the meantime, patient is amenable to starting naltrexone in the setting of normal liver labs.  Currently on day 2 of a Librium taper.  Also noted vivid nightmares last night-likely due to keeping nicotine patch on at night.  Advised patient to remove patch before bedtime.  #Alcohol use disorder, severe -Continue librium taper -Continue Naltrexone 25 mg every day for alcohol cravings. -Continue CIWA protocol -Continue hydroxyzine 25 mg TID PRN -Continue Thiamine supplementation -Encouraged to participate in theraputic milieu.   #Cannabis use disorder -Will further discuss cannabis use throughout stay.   #Tobacco Use Disorder -Nicoderm patch 21  mg made available, patient reported nightmares after keeping the patch on overnight.  -Nicotine gum prn  #Dispo -Patient desires long-term residential substance use treatment, social work aware. Estimated LOS 3-5 days.     Recommendations  Based on my evaluation the patient does not appear to have an emergency medical condition.  Park Pope, MD 06/14/23  1:41 PM

## 2023-06-14 NOTE — Group Note (Unsigned)
Group Topic: Relapse and Recovery  Group Date: 06/14/2023 Start Time: 2000 End Time: 2100 Facilitators: Rae Lips B  Department: [facility]  Number of Participants: 4  Group Focus: acceptance Treatment Modality:  Individual Therapy and Leisure Development Interventions utilized were leisure development Purpose: increase insight  Name: DAYMION DEAL Date of Birth: [birth year]  MR: [id number]    Level of Participation: active Quality of Participation: attentive Interactions with others: gave feedback Mood/Affect: appropriate Triggers (if applicable): NA Cognition: coherent/clear Progress: Gaining insight Response: NA Plan: patient will be encouraged to keep going to groups.   Patients Problems:  Patient Active Problem List   Diagnosis Date Noted   Alcohol use disorder, severe, dependence (HCC) 06/12/2023

## 2023-06-14 NOTE — Group Note (Signed)
Group Topic: Communication  Group Date: 06/14/2023 Start Time: 0900 End Time: 0945 Facilitators: Prentice Docker, RN  Department: [facility]  Number of Participants: 5  Group Focus:  Treatment Modality:  Individual Therapy Interventions utilized were patient education Purpose: increase insight  Name: Timothy Estrada Date of Birth: 02-04-1971  MR: [id number]      Patients Problems:  Level of Participation: active Quality of Participation: attentive, cooperative, and motivated Interactions with others: gave feedback Mood/Affect: appropriate Triggers (if applicable): n/a Cognition: insightful Progress: Gaining insight Response: n/a Plan: patient will be encouraged to continue medication compliance

## 2023-06-14 NOTE — ED Notes (Signed)
Pt asleep at this hour. No apparent distress. RR even and unlabored. Monitored for safety.  

## 2023-06-14 NOTE — ED Notes (Signed)
Patient A&Ox4. Denies intent to harm self/others when asked. Denies A/VH. Patient denies any physical complaints when asked. No acute distress noted. Interacting well with peers and staff. Routine safety checks conducted according to facility protocol. Encouraged patient to notify staff if thoughts of harm toward self or others arise. Patient verbalize understanding and agreement. Will continue to monitor for safety.

## 2023-06-14 NOTE — ED Notes (Signed)
Pt sitting in dayroom interacting with peers. No acute distress noted. No concerns voiced. Informed pt to notify staff with any needs or assistance. Pt verbalized understanding or agreement. Will continue to monitor for safety. 

## 2023-06-14 NOTE — ED Notes (Signed)
Patient is sleeping. Respirations equal and unlabored, skin warm and dry. No change in assessment or acuity. Routine safety checks conducted according to facility protocol. Will continue to monitor for safety.   

## 2023-06-15 DIAGNOSIS — F1210 Cannabis abuse, uncomplicated: Secondary | ICD-10-CM | POA: Diagnosis not present

## 2023-06-15 DIAGNOSIS — F1020 Alcohol dependence, uncomplicated: Secondary | ICD-10-CM | POA: Diagnosis not present

## 2023-06-15 DIAGNOSIS — F17210 Nicotine dependence, cigarettes, uncomplicated: Secondary | ICD-10-CM | POA: Diagnosis not present

## 2023-06-15 NOTE — Group Note (Signed)
Group Topic: Relaxation  Group Date: 06/15/2023 Start Time: 1930 End Time: 1952 Facilitators: Emmit Pomfret D, NT  Department: [facility]  Number of Participants: 5  Group Focus: relaxation Treatment Modality:  Psychoeducation Interventions utilized were leisure development Purpose: reinforce self-care  Name: Timothy Estrada Date of Birth: 04/08/1971  MR: [id number]    Level of Participation: moderate Quality of Participation: attentive Interactions with others: gave feedback Mood/Affect: appropriate Triggers (if applicable): n/a Cognition: coherent/clear Progress: Significant Response: n/a Plan: follow-up needed  Patients Problems:  Patient Active Problem List   Diagnosis Date Noted   Alcohol use disorder, severe, dependence (HCC) 06/12/2023

## 2023-06-15 NOTE — ED Notes (Signed)
Received patient this PM. Patient sitting in milieu watching TV with peers. Patient respirations are even and unlabored. Will continue to monitor for safety.

## 2023-06-15 NOTE — Group Note (Signed)
Group Topic: Relaxation  Group Date: 06/15/2023 Start Time: 1145 End Time: 1210 Facilitators: Jenean Lindau, RN  Department: [facility]  Number of Participants: 6  Group Focus: relaxation Treatment Modality:  Behavior Modification Therapy Interventions utilized were patient education Purpose: reinforce self-care  Name: Timothy Estrada Date of Birth: [birth year]  MR: [id number]    Level of Participation: moderate Quality of Participation: attentive and cooperative Interactions with others: gave feedback Mood/Affect: appropriate and blunted Triggers (if applicable):   Cognition: goal directed Progress: Moderate Response:   Plan: follow-up needed  Patients Problems:  Patient Active Problem List   Diagnosis Date Noted   Alcohol use disorder, severe, dependence (HCC) 06/12/2023

## 2023-06-15 NOTE — ED Notes (Signed)
Patient is sleeping. Respirations equal and unlabored, skin warm and dry. No change in assessment or acuity. Routine safety checks conducted according to facility protocol. Will continue to monitor for safety.   

## 2023-06-15 NOTE — ED Provider Notes (Signed)
Facility Based Crisis Progress Note  Date: 06/15/23 Patient Name: Timothy Estrada MRN: [id number] Chief Complaint: Alcohol detox  Diagnoses:  Final diagnoses:  Alcohol use disorder, severe, dependence (HCC)    Subjective: Timothy Estrada is a 52 year old male with no significant past psychiatric presented to [facility] on 7/10 requesting alcohol detox.  Per chart review, patient has been drinking for many decades, but alcohol use has increased significantly since ~2020 up to 1 pint/one fifth of vodka daily.  Also endorses marijuana and tobacco use.   On interview, patient states he is doing "fine".  He feels he is back to baseline. He denies SI/HI/AVH.  He denies significant alcohol withdrawal at this time. All questions addressed. Continued plan to go to [facility]. He plans to call tomorrow.   PHQ 2-9:   Flowsheet Row ED from 06/12/2023 in [facility] Most recent reading at 06/12/2023  2:35 PM ED from 06/12/2023 in [facility] Most recent reading at 06/12/2023  8:32 AM ED from 06/11/2023 in [facility] Most recent reading at 06/11/2023 11:07 PM  C-SSRS RISK CATEGORY No Risk No Risk No Risk       Screenings    Flowsheet Row Most Recent Value  CIWA-Ar Total 0       Total Time spent with patient: 30 minutes  Musculoskeletal  Strength & Muscle Tone: within normal limits Gait & Station: normal Patient leans: N/A  Psychiatric Specialty Exam  Presentation General Appearance:  Appropriate for Environment; Casual  Eye Contact: Fair  Speech: Garbled  Speech Volume: Normal  Handedness: Right   Mood and Affect  Mood: Euthymic  Affect: Congruent   Thought Process  Thought Processes: Coherent  Descriptions of Associations:Intact  Orientation:Full (Time, Place and Person)  Thought Content:Logical    Hallucinations:No data recorded  Ideas of Reference:None  Suicidal Thoughts:No data  recorded  Homicidal Thoughts:No data recorded   Sensorium  Memory: Immediate Fair; Recent Poor; Remote Fair  Judgment: Fair  Insight: Fair   Chartered certified accountant: Fair  Attention Span: Fair  Recall: [employer] of Knowledge: Fair  Language: Fair   Psychomotor Activity  Psychomotor Activity: No data recorded   Assets  Assets: Communication Skills; Desire for Improvement; Leisure Time; Physical Health; Financial Resources/Insurance; Social Support; Housing   Sleep  Sleep: No data recorded   No data recorded   Physical Exam Vitals reviewed.  Constitutional:      General: He is not in acute distress.    Appearance: He is normal weight.  HENT:     Head: Normocephalic and atraumatic.     Mouth/Throat:     Mouth: Mucous membranes are dry.  Eyes:     Extraocular Movements: Extraocular movements intact.  Pulmonary:     Effort: Pulmonary effort is normal.  Abdominal:     General: Abdomen is flat.  Musculoskeletal:        General: Normal range of motion.     Cervical back: Normal range of motion.  Neurological:     General: No focal deficit present.     Mental Status: He is alert. Mental status is at baseline.  Psychiatric:        Mood and Affect: Mood normal.        Behavior: Behavior normal.   Review of Systems  Psychiatric/Behavioral:  Positive for substance abuse. Negative for hallucinations and suicidal ideas. The patient has insomnia. The patient is not nervous/anxious.     Blood pressure Marland Kitchen)  120/105, pulse 71, temperature (!) 97.5 F (36.4 C), temperature source Oral, resp. rate 18, SpO2 99%. There is no height or weight on file to calculate BMI.  Past Psychiatric History: Patient does not have any previous psychiatric diagnoses.  No previous medication trials.  Has never seen a psychiatrist or needed inpatient services for psychiatric reasons.  Has engaged with inpatient substance use treatment of various kinds over the  years, to little effect.  Is the patient at risk to self? No  Has the patient been a risk to self in the past 6 months? No .    Has the patient been a risk to self within the distant past? No   Is the patient a risk to others? No   Has the patient been a risk to others in the past 6 months? No   Has the patient been a risk to others within the distant past? No   Past Medical History: Denies relevant past medical history. Family History: Denies past family medical history.  EtOH history in mother, brother, father. Social History: Smokes 1 pack of cigarettes a day.  Last Labs:  Admission on 06/12/2023, Discharged on 06/12/2023  Component Date Value Ref Range Status   WBC 06/12/2023 6.3  4.0 - 10.5 K/uL Final   RBC 06/12/2023 4.40  4.22 - 5.81 MIL/uL Final   Hemoglobin 06/12/2023 13.8  13.0 - 17.0 g/dL Final   HCT 30/86/5784 41.7  39.0 - 52.0 % Final   MCV 06/12/2023 94.8  80.0 - 100.0 fL Final   MCH 06/12/2023 31.4  26.0 - 34.0 pg Final   MCHC 06/12/2023 33.1  30.0 - 36.0 g/dL Final   RDW 69/62/9528 12.8  11.5 - 15.5 % Final   Platelets 06/12/2023 234  150 - 400 K/uL Final   nRBC 06/12/2023 0.0  0.0 - 0.2 % Final   Neutrophils Relative % 06/12/2023 45  % Final   Neutro Abs 06/12/2023 2.9  1.7 - 7.7 K/uL Final   Lymphocytes Relative 06/12/2023 36  % Final   Lymphs Abs 06/12/2023 2.3  0.7 - 4.0 K/uL Final   Monocytes Relative 06/12/2023 13  % Final   Monocytes Absolute 06/12/2023 0.8  0.1 - 1.0 K/uL Final   Eosinophils Relative 06/12/2023 5  % Final   Eosinophils Absolute 06/12/2023 0.3  0.0 - 0.5 K/uL Final   Basophils Relative 06/12/2023 1  % Final   Basophils Absolute 06/12/2023 0.0  0.0 - 0.1 K/uL Final   Immature Granulocytes 06/12/2023 0  % Final   Abs Immature Granulocytes 06/12/2023 0.01  0.00 - 0.07 K/uL Final   Performed at [facility], [address], [place], [place] [address]   Sodium 06/12/2023 138  135 - 145 mmol/L Final   Potassium 06/12/2023 4.2  3.5 - 5.1  mmol/L Final   Chloride 06/12/2023 103  98 - 111 mmol/L Final   CO2 06/12/2023 25  22 - 32 mmol/L Final   Glucose, Bld 06/12/2023 85  70 - 99 mg/dL Final   Glucose reference range applies only to samples taken after fasting for at least 8 hours.   BUN 06/12/2023 15  6 - 20 mg/dL Final   Creatinine, Ser 06/12/2023 0.92  0.61 - 1.24 mg/dL Final   Calcium 40/09/2724 9.0  8.9 - 10.3 mg/dL Final   Total Protein 36/64/4034 7.0  6.5 - 8.1 g/dL Final   Albumin 74/25/9563 3.8  3.5 - 5.0 g/dL Final   AST 87/56/4332 26  15 -  41 U/L Final   ALT 06/12/2023 30  0 - 44 U/L Final   Alkaline Phosphatase 06/12/2023 62  38 - 126 U/L Final   Total Bilirubin 06/12/2023 0.2 (L)  0.3 - 1.2 mg/dL Final   GFR, Estimated 06/12/2023 >60  >60 mL/min Final   Comment: (NOTE) Calculated using the CKD-EPI Creatinine Equation (2021)    Anion gap 06/12/2023 10  5 - 15 Final   Performed at [facility], [address] [place], [place] [address]   Hgb A1c MFr Bld 06/12/2023 5.2  4.8 - 5.6 % Final   Comment: (NOTE)         Prediabetes: 5.7 - 6.4         Diabetes: >6.4         Glycemic control for adults with diabetes: <7.0    Mean Plasma Glucose 06/12/2023 103  mg/dL Final   Comment: (NOTE) Performed At: [facility] [address] [place], [place] [id number] Jolene Schimke MD MV:[id number]    Alcohol, Ethyl (B) 06/12/2023 <10  <10 mg/dL Final   Comment: (NOTE) Lowest detectable limit for serum alcohol is 10 mg/dL.  For medical purposes only. Performed at [facility], [address], [place], [place] [address]    RPR Ser Ql 06/12/2023 NON REACTIVE  NON REACTIVE Final   Performed at [facility], [address], [place], [place] [address]   POC Amphetamine UR 06/12/2023 None Detected  NONE DETECTED (Cut Off Level 1000 ng/mL) Final   POC Secobarbital (BAR) 06/12/2023 None Detected  NONE DETECTED (Cut Off Level 300 ng/mL) Final   POC Buprenorphine (BUP) 06/12/2023 None Detected  NONE  DETECTED (Cut Off Level 10 ng/mL) Final   POC Oxazepam (BZO) 06/12/2023 None Detected  NONE DETECTED (Cut Off Level 300 ng/mL) Final   POC Cocaine UR 06/12/2023 None Detected  NONE DETECTED (Cut Off Level 300 ng/mL) Final   POC Methamphetamine UR 06/12/2023 None Detected  NONE DETECTED (Cut Off Level 1000 ng/mL) Final   POC Morphine 06/12/2023 None Detected  NONE DETECTED (Cut Off Level 300 ng/mL) Final   POC Methadone UR 06/12/2023 None Detected  NONE DETECTED (Cut Off Level 300 ng/mL) Final   POC Oxycodone UR 06/12/2023 None Detected  NONE DETECTED (Cut Off Level 100 ng/mL) Final   POC Marijuana UR 06/12/2023 Positive (A)  NONE DETECTED (Cut Off Level 50 ng/mL) Final   Cholesterol 06/12/2023 180  0 - 200 mg/dL Final   Triglycerides 27/25/3664 85  <150 mg/dL Final   HDL 40/34/7425 73  >40 mg/dL Final   Total CHOL/HDL Ratio 06/12/2023 2.5  RATIO Final   VLDL 06/12/2023 17  0 - 40 mg/dL Final   LDL Cholesterol 06/12/2023 90  0 - 99 mg/dL Final   Comment:        Total Cholesterol/HDL:CHD Risk Coronary Heart Disease Risk Table                     Men   Women  1/2 Average Risk   3.4   3.3  Average Risk       5.0   4.4  2 X Average Risk   9.6   7.1  3 X Average Risk  23.4   11.0        Use the calculated Patient Ratio above and the CHD Risk Table to determine the patient's CHD Risk.        ATP III CLASSIFICATION (LDL):  <100  mg/dL   Optimal  409-811  mg/dL   Near or Above                    Optimal  130-159  mg/dL   Borderline  914-782  mg/dL   High  >956     mg/dL   Very High Performed at [facility], [address] [place], [place] [address]    TSH 06/12/2023 1.757  0.350 - 4.500 uIU/mL Final   Comment: Performed by a 3rd Generation assay with a functional sensitivity of <=0.01 uIU/mL. Performed at [facility], [address] [place], [place] [address]     Allergies: Patient has no known allergies.  Medications:  Facility Ordered Medications  Medication    [COMPLETED] thiamine (VITAMIN B1) injection 100 mg   acetaminophen (TYLENOL) tablet 650 mg   alum & mag hydroxide-simeth (MAALOX/MYLANTA) 200-200-20 MG/5ML suspension 30 mL   magnesium hydroxide (MILK OF MAGNESIA) suspension 30 mL   hydrOXYzine (ATARAX) tablet 25 mg   traZODone (DESYREL) tablet 50 mg   multivitamin with minerals tablet 1 tablet   chlordiazePOXIDE (LIBRIUM) capsule 25 mg   loperamide (IMODIUM) capsule 2-4 mg   ondansetron (ZOFRAN-ODT) disintegrating tablet 4 mg   [EXPIRED] chlordiazePOXIDE (LIBRIUM) capsule 25 mg   Followed by   [COMPLETED] chlordiazePOXIDE (LIBRIUM) capsule 25 mg   Followed by   [COMPLETED] chlordiazePOXIDE (LIBRIUM) capsule 25 mg   Followed by   chlordiazePOXIDE (LIBRIUM) capsule 25 mg   thiamine (VITAMIN B1) tablet 100 mg   [COMPLETED] nicotine (NICODERM CQ - dosed in mg/24 hours) patch 21 mg   naltrexone (DEPADE) tablet 25 mg   nicotine polacrilex (NICORETTE) gum 2 mg    Long Term Goals: Improvement in symptoms so as ready for discharge  Short Term Goals: Patient will verbalize feelings in meetings with treatment team members., Patient will attend at least of 50% of the groups daily., Pt will complete the PHQ9 on admission, day 3 and discharge., Patient will participate in completing the [place] Suicide Severity Rating Scale, Patient will score a low risk of violence for 24 hours prior to discharge, and Patient will take medications as prescribed daily.  Medical Decision Making   HPI: Gerome Hayslett is a 52 year old male with no significant past psychiatric presented to [facility] on 7/10 requesting alcohol detox.   Patient is a soft-spoken older man who does not appear to be exhibiting symptoms of acute alcohol withdrawal.  After speaking with him, it is clear that alcohol has had a serious effect on the trajectory of his life thus far.  His only periods of sobriety appear to have occurred while he was in active residential substance use treatment.  We  discussed what would make this prospective residential treatment post-discharge from [facility] different-he said that he would be interested in taking up with AA after completing long-term substance use treatment.  Will reach out to mother, [facility], whom he lives with, later this afternoon to coordinate additional support, and to contract for safety in the event that he is discharged home.  In the meantime, patient is amenable to starting naltrexone in the setting of normal liver labs.  Currently on day 2 of a Librium taper.  Also noted vivid nightmares last night-likely due to keeping nicotine patch on at night.  Advised patient to remove patch before bedtime.  #Alcohol use disorder, severe -Continue librium taper -Continue Naltrexone 25 mg every day for alcohol cravings. -Continue CIWA protocol -Continue hydroxyzine 25 mg  TID PRN -Continue Thiamine supplementation -Encouraged to participate in theraputic milieu.   #Cannabis use disorder -Will further discuss cannabis use throughout stay.   #Tobacco Use Disorder -Nicoderm patch 21 mg made available, patient reported nightmares after keeping the patch on overnight.  -Nicotine gum prn  #Dispo -Patient desires long-term residential substance use treatment, social work aware. Estimated LOS 3-5 days. Goal is [facility] at this time    Recommendations  Based on my evaluation the patient does not appear to have an emergency medical condition.  Park Pope, MD 06/15/23  10:40 AM

## 2023-06-15 NOTE — ED Notes (Signed)
Patient has been awake and alert all day mostly sitting in the dayroom conversing with peers He is calm and pleasant. Will monitor and provide sake environment.  Denies avh shi or plan.  Will monitor closely.

## 2023-06-16 DIAGNOSIS — F1020 Alcohol dependence, uncomplicated: Secondary | ICD-10-CM | POA: Diagnosis not present

## 2023-06-16 DIAGNOSIS — F17210 Nicotine dependence, cigarettes, uncomplicated: Secondary | ICD-10-CM | POA: Diagnosis not present

## 2023-06-16 DIAGNOSIS — F1210 Cannabis abuse, uncomplicated: Secondary | ICD-10-CM | POA: Diagnosis not present

## 2023-06-16 MED ORDER — TRAZODONE HCL 50 MG PO TABS: 50.0000 mg | ORAL_TABLET | Freq: Every evening | ORAL | 0 refills | Status: DC | PRN

## 2023-06-16 MED ORDER — NALTREXONE HCL 50 MG PO TABS: 50.0000 mg | ORAL_TABLET | Freq: Every day | ORAL | 0 refills | Status: AC

## 2023-06-16 MED ORDER — HYDROXYZINE HCL 25 MG PO TABS: 25.0000 mg | ORAL_TABLET | Freq: Three times a day (TID) | ORAL | 0 refills | Status: DC | PRN

## 2023-06-16 MED ORDER — VITAMIN B-1 100 MG PO TABS: 100.0000 mg | ORAL_TABLET | Freq: Every day | ORAL | 0 refills | Status: AC

## 2023-06-16 NOTE — Discharge Instructions (Signed)
[facility] [address] [place], [place], [address] [phone] phone  New Patient Assessment/Therapy Walk-Ins:  Monday and Wednesday: 8 am until slots are full. Every 1st and 2nd Fridays of the month: 1 pm - 5 pm.  NO ASSESSMENT/THERAPY WALK-INS ON TUESDAYS OR THURSDAYS  New Patient Assessment/Medication Management Walk-Ins:  Monday - Friday:  8 am - 11 am.  For all walk-ins, we ask that you arrive by 7:30 am because patients will be seen in the order of arrival.  Availability is limited; therefore, you may not be seen on the same day that you walk-in.  Our goal is to serve and meet the needs of our community to the best of our ability.  SUBSTANCE USE TREATMENT for Medicaid and State Funded/IPRS  [facility] [address], [place], [address] [phone] phone NOTE: [facility] is no longer offering IOP services.  Serves those who are low-income or have no insurance.  [facility] [address], [place], [place], [address] [phone] phone [phone] fax NOTE: Does have Substance Abuse-Intensive Outpatient Program (SAIOP) as well as transitional housing if eligible.  [facility] [address] [place], [place], [address] [phone] phone [phone] fax  [facility] [address] [place], [place], [address] [phone] phone [phone] fax  HALFWAY HOUSES:  [facility] [phone]  [facility] www.oxfordvacancies.com  12 STEP PROGRAMS:  Alcoholics Anonymous of [place] https://aagreensboronc.com/meeting  Narcotics Anonymous of [place] https://greensborona.org/meetings/  Al-Anon of [place], [place] www.greens[place]alanon.org/find-meetings.html  Nar-Anon https://nar-anon.org/find-a-meetin  List of Residential placements:   [facility] in [place]: [phone]  [facility]: [phone]  [facility]: [place], [place]  [phone]: Male and male facility; 30-day program: (uninsured and Medicaid such as [facility], [facility], [facility], partners)  [facility]: [phone]; men and women's facility; 28 days; Can have Medicaid tailored plan ([facility] or [facility])  [facility]: [phone] [place] or [place]; 28 day program; must be fully detox; tailored Medicaid or no insurance  [facility] in [place], [place]; [phone]; 28 day all males program; no insurance accepted  [facility] in [place]: Joe [phone] (no insurance or Medicaid only); 90 days; outpatient services but provide housing in apartments [place]  [facility] Admission: [phone]: Patient must complete phone screening for placement: [place], [place]; 6 month program; uninsured, Medicaid, and [facility] insurance.   [facility]: no insurance required; [phone]  [place] [facility]: [phone]; Intake: Robert; Must fill out application online; Victor Delay [phone] x [phone]  [facility] in [place], [place]: [phone]; Admissions Coordinators Mr. Dennis or David Gibson; 90 day program.  [facility]: [place], [place] [phone]; Co-Ed 9 month to a year program; Online application; Men entry fee is $500 (6-12months);  [facility]: [address] [place], [place] [address]; no fee or insurance required; minimum of 2 years; Highly structured; work based; Intake Coordinator is Chris [phone]  [facility] in [place], [place]: [phone]; Fax number is [phone]; website: www.[facility].org; Requires 3-6 page autobiography; 2 year program (18 months and then 6month transitional housing); Admission fee is $300; no insurance needed; work program  [facility] in [place], [place]: Front Desk Staff: Reeci [phone]: They have a Men's Regenerations Program 6-9months. Free program; There is an initial $300 fee however, they are willing to work  with patients regarding that. Application is online.  [facility]: Admissions [phone] Benjamin Cox ext [phone]; Any 7-90 day program is out of pocket; 12   month program is free of charge; there is a $275 entry fee; Patient is responsible for own transportation 

## 2023-06-16 NOTE — Progress Notes (Signed)
Pt is awake, alert and oriented X4. Pt did not voice any complaints of pain or discomfort. No signs of acute distress noted. Administered scheduled meds with no issue. Pt denies current SI/HI/AVH, plan or intent. Staff will monitor for pt's safety. 

## 2023-06-16 NOTE — ED Notes (Signed)
Patient denies SI,HI,AVH. Night medication administered without difficulty to patient. Patient is cooperative and interacts well with staff. Patient has no complaints and shows no signs of ETOH withdrawal. Patient vital signs was taken and CIWA= 0. Respiratory is even and unlabored. No distress noted. Patient sleeping or resting in bed at present. Will continue to monitor for safety.

## 2023-06-16 NOTE — Discharge Summary (Signed)
Sherrian Divers Angelo to be D/C'd Home per MD order. Discussed with the patient and all questions fully answered. An After Visit Summary was printed and given to the patient. All belongings were returned to patient. Patient escorted out and D/C home via private auto.  Dickie La  06/16/2023 11:58 AM

## 2023-06-16 NOTE — Group Note (Signed)
Group Topic: Wellness  Group Date: 06/16/2023 Start Time: 1000 End Time: 1025 Facilitators: Londell Moh, NT  Department: [facility]  Number of Participants: 8  Group Focus: clarity of thought Treatment Modality:  Psychoeducation Interventions utilized were leisure development Purpose: express feelings and increase insight  Name: Timothy Estrada Date of Birth: March 13, 1971  MR: [id number]    Level of Participation: active Quality of Participation: attentive Interactions with others: gave feedback Mood/Affect: appropriate Triggers (if applicable): n/a Cognition: coherent/clear Progress: Gaining insight Response: Pt was active during group and he shared with others how he is feeling. Plan: patient will be encouraged to attend future groups  Patients Problems:  Patient Active Problem List   Diagnosis Date Noted   Alcohol use disorder, severe, dependence (HCC) 06/12/2023

## 2023-06-16 NOTE — Discharge Planning (Signed)
LCSW spoke with patient on this morning who reports he would like to be discharged on today as he has some things to take care of and he wants to return back to work. Patient reports his plan is to return home with transportation provided via bus ticket. Patient was informed that he was accepted into [facility], however LCSW would have to follow up later for admit date. Patient reports he is aware of this information and plans on following up with [facility] once he has taken care of his personal responsibilities. Patient reported appreciation for LCSW assistance. Brief supportive counseling was provided to the patient regarding process and if he went and used then he would need to restart process over. Patient hesitated and stated "Really"? LCSW confirmed and patient reports understanding and reports he will figure things out. Patient aware that outpatient resources have been added to his AVS for his review. No other needs were reported at this time. LCSW to sign off. Please inform if further LCSW needs arise prior to discharge.   Fernande Boyden, LCSW Clinical Social Worker [facility] Ph: [phone]

## 2023-06-16 NOTE — ED Provider Notes (Signed)
FBC/OBS ASAP Discharge Summary  Date and Time: 06/16/2023 3:06 PM  Name: Timothy Estrada  MRN:  [id number]   Discharge Diagnoses:  Final diagnoses:  Alcohol use disorder, severe, dependence (HCC)    Subjective: No acute events overnight. Blood pressure 119/96.  Otherwise within normal limits.  No new labs.  Finished Librium taper yesterday 7/15.  Received as needed trazodone 50 mg for sleep.  On interview, patient mood was "good".  He was at psychiatric baseline this morning.  Sleeping and eating well.  Asked to go home today as he has a 3-week job lined up, and he is confident he will be able to abstain from alcohol during that period.  Asked for AA meeting times and [facility], patient was given these resources.  Denied HI, SI, and AVH.  Stay Summary: Patient stay was almost entirely uneventful successfully completed 4-day Librium 25 mg taper (7/12 - 7/15).  Withdrawal symptoms virtually absent.  Begun on naltrexone 25 mg, discharged on 50 mg.  Patient received trazodone 50 mg as needed for sleep and hydroxyzine 25 mg x 1 on the first day of his stay-he did not require any further doses for anxiety.  Patient was pleasant, attentive to interview, and denied SI, HI, and AVH throughout.  However, he was a poor historian and was unable to provide precise dates as to periods of sobriety/past residential substance use stays.  He plans to follow up with AA in an outpatient setting and continue naltrexone to reduce cravings.  Total Time spent with patient: 2 hours  Past Psychiatric History: Patient does not have any previous psychiatric diagnoses. No previous medication trials. Has never seen a psychiatrist or needed inpatient services for psychiatric reasons. Has engaged with inpatient substance use treatment of various kinds over the years, to little effect.  Past Medical History: Patient denied past medical history.  Does not see a PCP. Family History: Denies. Family Psychiatric History: Alcohol  history in mother, brother, father. Social History: Smokes 1 pack of cigarette per day.  Current Medications:  Current Facility-Administered Medications  Medication Dose Route Frequency Provider Last Rate Last Admin   acetaminophen (TYLENOL) tablet 650 mg  650 mg Oral Q6H PRN White, Patrice L, NP       alum & mag hydroxide-simeth (MAALOX/MYLANTA) 200-200-20 MG/5ML suspension 30 mL  30 mL Oral Q4H PRN White, Patrice L, NP       hydrOXYzine (ATARAX) tablet 25 mg  25 mg Oral TID PRN White, Patrice L, NP   25 mg at 06/13/23 2144   magnesium hydroxide (MILK OF MAGNESIA) suspension 30 mL  30 mL Oral Daily PRN White, Patrice L, NP       multivitamin with minerals tablet 1 tablet  1 tablet Oral Daily White, Patrice L, NP   1 tablet at 06/16/23 0837   naltrexone (DEPADE) tablet 25 mg  25 mg Oral Daily Tomie China, MD   25 mg at 06/16/23 4401   nicotine polacrilex (NICORETTE) gum 2 mg  2 mg Oral Q4H PRN Park Pope, MD   2 mg at 06/14/23 2116   thiamine (VITAMIN B1) tablet 100 mg  100 mg Oral Daily White, Patrice L, NP   100 mg at 06/16/23 0837   traZODone (DESYREL) tablet 50 mg  50 mg Oral QHS PRN White, Patrice L, NP   50 mg at 06/15/23 2112   Current Outpatient Medications  Medication Sig Dispense Refill   hydrOXYzine (ATARAX) 25 MG tablet Take 1 tablet (25 mg total) by  mouth 3 (three) times daily as needed for anxiety. 30 tablet 0   [START ON 06/17/2023] naltrexone (DEPADE) 50 MG tablet Take 1 tablet (50 mg total) by mouth daily. 30 tablet 0   [START ON 06/17/2023] thiamine (VITAMIN B-1) 100 MG tablet Take 1 tablet (100 mg total) by mouth daily. 30 tablet 0   traZODone (DESYREL) 50 MG tablet Take 1 tablet (50 mg total) by mouth at bedtime as needed for sleep. 30 tablet 0    PTA Medications:  PTA Medications  Medication Sig   hydrOXYzine (ATARAX) 25 MG tablet Take 1 tablet (25 mg total) by mouth 3 (three) times daily as needed for anxiety.   traZODone (DESYREL) 50 MG tablet Take 1 tablet (50  mg total) by mouth at bedtime as needed for sleep.   [START ON 06/17/2023] naltrexone (DEPADE) 50 MG tablet Take 1 tablet (50 mg total) by mouth daily.   [START ON 06/17/2023] thiamine (VITAMIN B-1) 100 MG tablet Take 1 tablet (100 mg total) by mouth daily.   Facility Ordered Medications  Medication   [COMPLETED] thiamine (VITAMIN B1) injection 100 mg   acetaminophen (TYLENOL) tablet 650 mg   alum & mag hydroxide-simeth (MAALOX/MYLANTA) 200-200-20 MG/5ML suspension 30 mL   magnesium hydroxide (MILK OF MAGNESIA) suspension 30 mL   hydrOXYzine (ATARAX) tablet 25 mg   traZODone (DESYREL) tablet 50 mg   multivitamin with minerals tablet 1 tablet   [EXPIRED] chlordiazePOXIDE (LIBRIUM) capsule 25 mg   [EXPIRED] loperamide (IMODIUM) capsule 2-4 mg   [EXPIRED] ondansetron (ZOFRAN-ODT) disintegrating tablet 4 mg   [EXPIRED] chlordiazePOXIDE (LIBRIUM) capsule 25 mg   Followed by   [COMPLETED] chlordiazePOXIDE (LIBRIUM) capsule 25 mg   Followed by   [COMPLETED] chlordiazePOXIDE (LIBRIUM) capsule 25 mg   Followed by   [COMPLETED] chlordiazePOXIDE (LIBRIUM) capsule 25 mg   thiamine (VITAMIN B1) tablet 100 mg   [COMPLETED] nicotine (NICODERM CQ - dosed in mg/24 hours) patch 21 mg   naltrexone (DEPADE) tablet 25 mg   nicotine polacrilex (NICORETTE) gum 2 mg       06/16/2023   11:37 AM 06/15/2023   10:40 AM 06/14/2023   11:07 AM  Depression screen PHQ 2/9  Decreased Interest 0 0 0  Down, Depressed, Hopeless 0 0 0  PHQ - 2 Score 0 0 0    Flowsheet Row ED from 06/12/2023 in [facility] Most recent reading at 06/12/2023  2:35 PM ED from 06/12/2023 in [facility] Most recent reading at 06/12/2023  8:32 AM ED from 06/11/2023 in [facility] Most recent reading at 06/11/2023 11:07 PM  C-SSRS RISK CATEGORY No Risk No Risk No Risk       Musculoskeletal  Strength & Muscle Tone: within normal limits Gait & Station:  normal Patient leans: N/A  Psychiatric Specialty Exam  Presentation  General Appearance:  Appropriate for Environment; Casual  Eye Contact: Good  Speech: Clear and Coherent  Speech Volume: Normal  Handedness: Right   Mood and Affect  Mood: Euthymic  Affect: Appropriate   Thought Processes: Coherent  Descriptions of Associations:Intact  Orientation:Full (Time, Place and Person)  Thought Content:Logical     Hallucinations:Hallucinations: None  Ideas of Reference:None  Suicidal Thoughts:Suicidal Thoughts: No  Homicidal Thoughts:Homicidal Thoughts: No   Sensorium  Memory: Immediate Fair; Recent Poor; Remote Fair  Judgment: Fair  Insight: Fair   Chartered certified accountant: Fair  Attention Span: Fair  Recall: [employer] of Knowledge: Fair  Language: Fair   Psychomotor Activity  Within normal limits  Assets  Assets: Communication Skills; Desire for Improvement; Leisure Time; Physical Health; Financial Resources/Insurance; Social Support; Housing   Sleep  Sleep: Slept well  Physical Exam  Physical Exam ROS Blood pressure (!) 119/96, pulse 69, temperature (!) 97.5 F (36.4 C), temperature source Oral, resp. rate 17, SpO2 100%. There is no height or weight on file to calculate BMI.  Demographic Factors:  Male, Caucasian, and Low socioeconomic status  Loss Factors: NA  Historical Factors: Family history of mental illness or substance abuse  Risk Reduction Factors:   Employed, Living with another person, especially a relative, and Positive social support  Continued Clinical Symptoms:  Alcohol/Substance Abuse/Dependencies  Cognitive Features That Contribute To Risk:  None    Suicide Risk:  Mild:  Suicidal ideation of limited frequency, intensity, duration, and specificity.  There are no identifiable plans, no associated intent, mild dysphoria and related symptoms, good self-control (both objective and subjective  assessment), few other risk factors, and identifiable protective factors, including available and accessible social support.  Plan Of Care/Follow-up recommendations:  Follow-up recommendations:  Activity:  Normal, as tolerated Diet:  Per PCP recommendation  Patient is instructed prior to discharge to: Take all medications as prescribed by her mental healthcare provider. Report any adverse effects and/or reactions from the medicines to her outpatient provider promptly. Patient has been instructed & cautioned: To not engage in alcohol and or illegal drug use while on prescription medicines.  In the event of worsening symptoms, patient is instructed to call the crisis hotline at 988, 911 and or go to the nearest ED for appropriate evaluation and treatment of symptoms. To follow-up with her primary care provider for your other medical issues, concerns and or health care needs.   Disposition: Home  Tomie China, MD 06/16/2023, 3:06 PM

## 2023-06-16 NOTE — ED Notes (Addendum)
Patient currently resting /sleeping in his bed. Will continue to monitor for safety.

## 2023-06-16 NOTE — ED Notes (Signed)
Pt was given breakfast this morning 

## 2023-06-18 NOTE — Group Note (Signed)
Group Topic: Social Support  Group Date: 06/15/2023 Start Time: 1015 End Time: 1040 Facilitators: Priscille Kluver, NT  Department: [facility]  Number of Participants: 4  Group Focus: relapse prevention Treatment Modality:  Solution-Focused Therapy Interventions utilized were support Purpose: express feelings  Name: Timothy Estrada Date of Birth: [birth year]  MR: [id number]    Level of Participation: active Quality of Participation: cooperative Interactions with others: gave feedback Mood/Affect: appropriate Triggers (if applicable):  Cognition: coherent/clear Progress: Gaining insight Response:  Plan: patient will be encouraged to continue to attend group. Patients Problems:  Patient Active Problem List   Diagnosis Date Noted   Alcohol use disorder, severe, dependence (HCC) 06/12/2023

## 2023-06-18 NOTE — Group Note (Signed)
Group Topic: Relapse and Recovery  Group Date: 06/14/2023 Start Time: 0945 End Time: 1015 Facilitators: Priscille Kluver, NT  Department: [facility]  Number of Participants: 6  Group Focus: substance abuse education Treatment Modality:  Patient-Centered Therapy Interventions utilized were story telling Purpose: enhance coping skills  Name: DANILE TRIER Date of Birth: 06-21-1971  MR: [id number]   Level of Participation: active Quality of Participation: cooperative Interactions with others: gave feedback Mood/Affect: appropriate Triggers (if applicable):  Cognition: coherent/clear Progress: Gaining insight Response:  Plan: patient will be encouraged to continue to attend group. Patients Problems:  Patient Active Problem List   Diagnosis Date Noted   Alcohol use disorder, severe, dependence (HCC) 06/12/2023
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
## 2023-10-04 ENCOUNTER — Encounter (HOSPITAL_COMMUNITY): Payer: Self-pay

## 2023-10-04 ENCOUNTER — Other Ambulatory Visit: Payer: Self-pay

## 2023-10-04 ENCOUNTER — Emergency Department (HOSPITAL_COMMUNITY): Payer: Self-pay

## 2023-10-04 ENCOUNTER — Emergency Department (HOSPITAL_COMMUNITY): Admission: EM | Admit: 2023-10-04 | Discharge: 2023-10-04 | Discharge status: Against Medical Advice | Payer: Self-pay

## 2023-10-04 DIAGNOSIS — F10920 Alcohol use, unspecified with intoxication, uncomplicated: Secondary | ICD-10-CM | POA: Insufficient documentation

## 2023-10-04 DIAGNOSIS — Y908 Blood alcohol level of 240 mg/100 ml or more: Secondary | ICD-10-CM | POA: Insufficient documentation

## 2023-10-04 DIAGNOSIS — R222 Localized swelling, mass and lump, trunk: Secondary | ICD-10-CM | POA: Insufficient documentation

## 2023-10-04 LAB — CBC WITH DIFFERENTIAL/PLATELET
Abs Immature Granulocytes: 0.02 10*3/uL (ref 0.00–0.07)
Basophils Absolute: 0.1 10*3/uL (ref 0.0–0.1)
Basophils Relative: 1 %
Eosinophils Absolute: 0.2 10*3/uL (ref 0.0–0.5)
Eosinophils Relative: 2 %
HCT: 47.3 % (ref 39.0–52.0)
Hemoglobin: 15.5 g/dL (ref 13.0–17.0)
Immature Granulocytes: 0 %
Lymphocytes Relative: 45 %
Lymphs Abs: 4 10*3/uL (ref 0.7–4.0)
MCH: 30.6 pg (ref 26.0–34.0)
MCHC: 32.8 g/dL (ref 30.0–36.0)
MCV: 93.5 fL (ref 80.0–100.0)
Monocytes Absolute: 0.5 10*3/uL (ref 0.1–1.0)
Monocytes Relative: 6 %
Neutro Abs: 4 10*3/uL (ref 1.7–7.7)
Neutrophils Relative %: 46 %
Platelets: 306 10*3/uL (ref 150–400)
RBC: 5.06 MIL/uL (ref 4.22–5.81)
RDW: 14.7 % (ref 11.5–15.5)
WBC: 8.8 10*3/uL (ref 4.0–10.5)
nRBC: 0 % (ref 0.0–0.2)

## 2023-10-04 LAB — SALICYLATE LEVEL: Salicylate Lvl: <7 mg/dL — ABNORMAL LOW (ref 7.0–30.0)

## 2023-10-04 LAB — COMPREHENSIVE METABOLIC PANEL
ALT: 15 U/L (ref 0–44)
AST: 23 U/L (ref 15–41)
Albumin: 4.5 g/dL (ref 3.5–5.0)
Alkaline Phosphatase: 77 U/L (ref 38–126)
Anion gap: 11 (ref 5–15)
BUN: 9 mg/dL (ref 6–20)
CO2: 24 mmol/L (ref 22–32)
Calcium: 8.7 mg/dL — ABNORMAL LOW (ref 8.9–10.3)
Chloride: 110 mmol/L (ref 98–111)
Creatinine, Ser: 0.87 mg/dL (ref 0.61–1.24)
GFR, Estimated: >60 mL/min (ref >60)
Glucose, Bld: 102 mg/dL — ABNORMAL HIGH (ref 70–99)
Potassium: 4 mmol/L (ref 3.5–5.1)
Sodium: 145 mmol/L (ref 135–145)
Total Bilirubin: 0.4 mg/dL (ref 0.3–1.2)
Total Protein: 7.7 g/dL (ref 6.5–8.1)

## 2023-10-04 LAB — RAPID URINE DRUG SCREEN, HOSP PERFORMED
Amphetamines: NOT DETECTED
Barbiturates: NOT DETECTED
Benzodiazepines: NOT DETECTED
Cocaine: NOT DETECTED
Opiates: NOT DETECTED
Tetrahydrocannabinol: POSITIVE — AB

## 2023-10-04 LAB — ACETAMINOPHEN LEVEL: Acetaminophen (Tylenol), Serum: <10 ug/mL — ABNORMAL LOW (ref 10–30)

## 2023-10-04 LAB — ETHANOL: Alcohol, Ethyl (B): 395 mg/dL (ref <10)

## 2023-10-04 NOTE — ED Notes (Signed)
Pt has refused CT scans.

## 2023-10-04 NOTE — ED Notes (Signed)
Pt became verbally and physically aggressive as this Clinical research associate was attempting to draw lab work.  Pt initially was calm and cooperative, but suddenly flipped when this writer pulled out the needle.  Security had to be called to the bedside.  EDP aware.    Pt was verbally deescalated by Security and ended up allowing labs to be drawn.

## 2023-10-04 NOTE — ED Triage Notes (Signed)
Patient BIB EMS for fall and ETOH. EMS was called out and found patient on the ground off the sidewalk. Patient was "sleeping." Aroused easy. Patient reports he drank "a lot of vodka," unsure of how much he drank. Patient is A&Ox3, unsteady on feet, and c/o abscess on his chest.  VSS, NAD

## 2023-10-04 NOTE — Discharge Instructions (Signed)
You left the emergency department prior to your workup being completed.  This is very dangerous.  You should return to the emergency department until you have been medically cleared.

## 2023-10-04 NOTE — ED Notes (Signed)
Pt is increasingly agitated and demanding his knife back states he wants to leave AMA

## 2023-10-04 NOTE — ED Notes (Signed)
Pt assisted to restroom.  

## 2023-10-04 NOTE — ED Provider Notes (Signed)
[name] EMERGENCY DEPARTMENT AT [facility] Provider Note   CSN: [id number] Arrival date & time: 10/04/23  1545     History  Chief Complaint  Patient presents with   Alcohol Intoxication    Timothy Estrada is a 52 y.o. male.  With a history of alcohol use disorder presenting to the ED for evaluation of alcohol intoxication.  He was reportedly found outside on the sidewalk unconscious.  EMS was called.  He immediately woke up.  States he has been drinking "a lot of vodka."  States he drinks 1 pint per day.  He has been doing this for quite some time.  He is unsure of any falls.  He denies SI, HI, AVH.  He is complaining of a mass to the right chest.  This is been present for a few years now.  He followed up with general surgery for removal but was unable to afford it at that time.  He states that has gotten increasingly more painful over the last couple of months.  No fevers or chills.  No difficulty breathing.   Alcohol Intoxication       Home Medications Prior to Admission medications   Medication Sig Start Date End Date Taking? Authorizing Provider  hydrOXYzine (ATARAX) 25 MG tablet Take 1 tablet (25 mg total) by mouth 3 (three) times daily as needed for anxiety. 06/16/23   Tomie China, MD  traZODone (DESYREL) 50 MG tablet Take 1 tablet (50 mg total) by mouth at bedtime as needed for sleep. 06/16/23 07/16/23  Tomie China, MD      Allergies    Patient has no known allergies.    Review of Systems   Review of Systems  Psychiatric/Behavioral:  Positive for confusion.   All other systems reviewed and are negative.   Physical Exam Updated Vital Signs BP (!) 130/103 (BP Location: Left Arm)   Pulse 76   Temp 97.7 F (36.5 C) (Oral)   Resp 16   Ht 5\' 6"  (1.676 m)   Wt 72.6 kg   SpO2 98%   BMI 25.82 kg/m  Physical Exam Vitals and nursing note reviewed.  Constitutional:      General: He is not in acute distress.    Appearance: He is  well-developed.  HENT:     Head: Normocephalic and atraumatic.  Eyes:     Conjunctiva/sclera: Conjunctivae normal.  Cardiovascular:     Rate and Rhythm: Normal rate and regular rhythm.     Heart sounds: No murmur heard. Pulmonary:     Effort: Pulmonary effort is normal. No respiratory distress.     Breath sounds: Normal breath sounds.  Abdominal:     Palpations: Abdomen is soft.     Tenderness: There is no abdominal tenderness.  Musculoskeletal:        General: No swelling.     Cervical back: Neck supple.     Comments: 8 cm mobile mass to the right anterior chest/areola.  No overlying erythema or induration.  No drainage.  Skin:    General: Skin is warm and dry.     Capillary Refill: Capillary refill takes less than 2 seconds.  Neurological:     Mental Status: He is alert.     Comments: Acutely intoxicated  Psychiatric:        Mood and Affect: Mood normal.     ED Results / Procedures / Treatments   Labs (all labs ordered are listed, but only abnormal results are displayed) Labs Reviewed  COMPREHENSIVE METABOLIC PANEL - Abnormal; Notable for the following components:      Result Value   Glucose, Bld 102 (*)    Calcium 8.7 (*)    All other components within normal limits  RAPID URINE DRUG SCREEN, HOSP PERFORMED - Abnormal; Notable for the following components:   Tetrahydrocannabinol POSITIVE (*)    All other components within normal limits  CBC WITH DIFFERENTIAL/PLATELET  ETHANOL  SALICYLATE LEVEL  ACETAMINOPHEN LEVEL    EKG None  Radiology No results found.  Procedures Ultrasound ED Soft Tissue  Date/Time: 10/04/2023 4:21 PM  Performed by: Michelle Piper, PA-C Authorized by: Michelle Piper, PA-C   Procedure details:    Indications: localization of abscess     Transverse view:  Visualized   Longitudinal view:  Visualized Location:    Location: chest   Findings:     no abscess present Comments:     8 cm mass to the right anterior chest wall.   Mass appears uniform.  No hypoechoic or anechoic areas to suggest abscess.     Medications Ordered in ED Medications - No data to display  ED Course/ Medical Decision Making/ A&P                                 Medical Decision Making Amount and/or Complexity of Data Reviewed Labs: ordered. Radiology: ordered.  This patient presents to the ED for concern of alcohol intoxication, this involves an extensive number of treatment options, and is a complaint that carries with it a high risk of complications and morbidity.  The differential diagnosis includes acute alcohol intoxication, withdrawal, encephalopathy  My initial workup includes labs, imaging  Additional history obtained from: Nursing notes from this visit. EMS provides a portion of the history  I ordered, reviewed and interpreted labs which include: CBC, CMP, ethanol, significant, salicylate, UDS  I ordered imaging studies including CT head and C-spine I independently visualized and interpreted imaging which showed patient declined imaging  Afebrile, hemodynamically stable.  52 year old male presenting to the ED for evaluation of acute alcohol intoxication.  He was found sleeping on the sidewalk so EMS was called.  EMS brings the patient into the emergency department.  He has no complaints at this time other than a chronic right anterior chest wall cyst.  He has follow-up with general surgery, most recently in 2020 for this but did not have the cyst removed due to financial reasons.  States the cyst has gotten more painful over the last 3 months or so.  Patient does appear acutely intoxicated, however is fully alert and oriented.  It was recommended to the patient that he stay for his full workup.  Patient wanted to leave so security escorted the patient out of the building prior to notifying nursing staff or myself.  I was unable to speak with the patient before he was escorted out.  I was unable to fully medically clear the  patient and we were unable to contact the patient to return to the emergency department.  Note: Portions of this report may have been transcribed using voice recognition software. Every effort was made to ensure accuracy; however, inadvertent computerized transcription errors may still be present.        Final Clinical Impression(s) / ED Diagnoses Final diagnoses:  Alcoholic intoxication without complication (HCC)    Rx / DC Orders ED Discharge Orders     None  Michelle Piper, PA-C 10/04/23 1722    Coral Spikes, DO 10/05/23 0001

## 2023-10-04 NOTE — ED Notes (Addendum)
Security responded to pt becoming agitated, the police officer stated the patient  could have his knife and leave.  Nursing staff and EDP did not give authorization for pt to leave.

## 2024-02-03 ENCOUNTER — Emergency Department (HOSPITAL_COMMUNITY): Admission: EM | Admit: 2024-02-03 | Discharge: 2024-02-04 | Discharge status: Against Medical Advice | Payer: Self-pay

## 2024-02-03 ENCOUNTER — Other Ambulatory Visit: Payer: Self-pay

## 2024-02-03 ENCOUNTER — Encounter (HOSPITAL_COMMUNITY): Payer: Self-pay

## 2024-02-03 ENCOUNTER — Emergency Department (HOSPITAL_COMMUNITY): Payer: Self-pay

## 2024-02-03 DIAGNOSIS — Z5321 Procedure and treatment not carried out due to patient leaving prior to being seen by health care provider: Secondary | ICD-10-CM | POA: Insufficient documentation

## 2024-02-03 DIAGNOSIS — W1830XA Fall on same level, unspecified, initial encounter: Secondary | ICD-10-CM | POA: Insufficient documentation

## 2024-02-03 DIAGNOSIS — S60222A Contusion of left hand, initial encounter: Secondary | ICD-10-CM | POA: Insufficient documentation

## 2024-02-03 NOTE — ED Notes (Signed)
 Applied nonadherent dressing, gauze, and coban to pt's left hand. Obvious swelling noted and wound is very tender to touch. Slow bleeding noted.

## 2024-02-03 NOTE — ED Triage Notes (Addendum)
 Pt was picked up at the [employer], worker called because they say him fall. He did not hit his head, no LOC, no blood thinners. Pt has drank 1 pint of vodka today. The complaint upon arrival was left hand pain with a hematoma.   Medic vitals  140/88 114hr [id number] 18rr

## 2024-06-18 ENCOUNTER — Other Ambulatory Visit: Payer: Self-pay

## 2024-06-18 ENCOUNTER — Inpatient Hospital Stay (HOSPITAL_COMMUNITY)
Admission: EM | Admit: 2024-06-18 | Discharge: 2024-06-21 | DRG: 535 | Disposition: A | Discharge status: Home / Self Care | Payer: Self-pay | Attending: General Surgery | Admitting: General Surgery

## 2024-06-18 ENCOUNTER — Emergency Department (HOSPITAL_COMMUNITY): Payer: Self-pay

## 2024-06-18 ENCOUNTER — Emergency Department (HOSPITAL_COMMUNITY): Payer: MEDICAID

## 2024-06-18 DIAGNOSIS — W11XXXA Fall on and from ladder, initial encounter: Secondary | ICD-10-CM | POA: Diagnosis present

## 2024-06-18 DIAGNOSIS — J439 Emphysema, unspecified: Secondary | ICD-10-CM | POA: Diagnosis present

## 2024-06-18 DIAGNOSIS — W19XXXA Unspecified fall, initial encounter: Secondary | ICD-10-CM | POA: Diagnosis present

## 2024-06-18 DIAGNOSIS — F10129 Alcohol abuse with intoxication, unspecified: Secondary | ICD-10-CM | POA: Diagnosis present

## 2024-06-18 DIAGNOSIS — S7012XA Contusion of left thigh, initial encounter: Secondary | ICD-10-CM | POA: Diagnosis present

## 2024-06-18 DIAGNOSIS — F17210 Nicotine dependence, cigarettes, uncomplicated: Secondary | ICD-10-CM | POA: Diagnosis present

## 2024-06-18 DIAGNOSIS — Y908 Blood alcohol level of 240 mg/100 ml or more: Secondary | ICD-10-CM | POA: Diagnosis present

## 2024-06-18 DIAGNOSIS — M79652 Pain in left thigh: Secondary | ICD-10-CM | POA: Diagnosis present

## 2024-06-18 DIAGNOSIS — M79651 Pain in right thigh: Secondary | ICD-10-CM | POA: Diagnosis present

## 2024-06-18 DIAGNOSIS — S32119A Unspecified Zone I fracture of sacrum, initial encounter for closed fracture: Secondary | ICD-10-CM | POA: Diagnosis present

## 2024-06-18 DIAGNOSIS — E872 Acidosis, unspecified: Secondary | ICD-10-CM | POA: Diagnosis present

## 2024-06-18 DIAGNOSIS — I700 Atherosclerosis of aorta: Secondary | ICD-10-CM | POA: Diagnosis present

## 2024-06-18 DIAGNOSIS — S32401A Unspecified fracture of right acetabulum, initial encounter for closed fracture: Secondary | ICD-10-CM | POA: Diagnosis present

## 2024-06-18 DIAGNOSIS — Z79899 Other long term (current) drug therapy: Secondary | ICD-10-CM

## 2024-06-18 DIAGNOSIS — F419 Anxiety disorder, unspecified: Secondary | ICD-10-CM | POA: Diagnosis present

## 2024-06-18 DIAGNOSIS — S32591A Other specified fracture of right pubis, initial encounter for closed fracture: Principal | ICD-10-CM | POA: Diagnosis present

## 2024-06-18 DIAGNOSIS — S32592A Other specified fracture of left pubis, initial encounter for closed fracture: Secondary | ICD-10-CM | POA: Diagnosis present

## 2024-06-18 LAB — I-STAT CHEM 8, ED
BUN: 10 mg/dL (ref 6–20)
Calcium, Ion: 1.03 mmol/L — ABNORMAL LOW (ref 1.15–1.40)
Chloride: 107 mmol/L (ref 98–111)
Creatinine, Ser: 1.3 mg/dL — ABNORMAL HIGH (ref 0.61–1.24)
Glucose, Bld: 92 mg/dL (ref 70–99)
HCT: 43 % (ref 39.0–52.0)
Hemoglobin: 14.6 g/dL (ref 13.0–17.0)
Potassium: 3.4 mmol/L — ABNORMAL LOW (ref 3.5–5.1)
Sodium: 143 mmol/L (ref 135–145)
TCO2: 23 mmol/L (ref 22–32)

## 2024-06-18 LAB — CBC WITH DIFFERENTIAL/PLATELET
Abs Immature Granulocytes: 0.22 K/uL — ABNORMAL HIGH (ref 0.00–0.07)
Basophils Absolute: 0.1 K/uL (ref 0.0–0.1)
Basophils Relative: 1 %
Eosinophils Absolute: 0.2 K/uL (ref 0.0–0.5)
Eosinophils Relative: 1 %
HCT: 41.9 % (ref 39.0–52.0)
Hemoglobin: 14.1 g/dL (ref 13.0–17.0)
Immature Granulocytes: 2 %
Lymphocytes Relative: 25 %
Lymphs Abs: 2.9 K/uL (ref 0.7–4.0)
MCH: 31.5 pg (ref 26.0–34.0)
MCHC: 33.7 g/dL (ref 30.0–36.0)
MCV: 93.5 fL (ref 80.0–100.0)
Monocytes Absolute: 0.8 K/uL (ref 0.1–1.0)
Monocytes Relative: 7 %
Neutro Abs: 7.3 K/uL (ref 1.7–7.7)
Neutrophils Relative %: 64 %
Platelets: 256 K/uL (ref 150–400)
RBC: 4.48 MIL/uL (ref 4.22–5.81)
RDW: 13.1 % (ref 11.5–15.5)
WBC: 11.5 K/uL — ABNORMAL HIGH (ref 4.0–10.5)
nRBC: 0 % (ref 0.0–0.2)

## 2024-06-18 LAB — COMPREHENSIVE METABOLIC PANEL WITH GFR
ALT: 82 U/L — ABNORMAL HIGH (ref 0–44)
AST: 99 U/L — ABNORMAL HIGH (ref 15–41)
Albumin: 3.9 g/dL (ref 3.5–5.0)
Alkaline Phosphatase: 62 U/L (ref 38–126)
Anion gap: 13 (ref 5–15)
BUN: 9 mg/dL (ref 6–20)
CO2: 23 mmol/L (ref 22–32)
Calcium: 8.6 mg/dL — ABNORMAL LOW (ref 8.9–10.3)
Chloride: 107 mmol/L (ref 98–111)
Creatinine, Ser: 0.86 mg/dL (ref 0.61–1.24)
GFR, Estimated: >60 mL/min (ref >60)
Glucose, Bld: 95 mg/dL (ref 70–99)
Potassium: 3.5 mmol/L (ref 3.5–5.1)
Sodium: 143 mmol/L (ref 135–145)
Total Bilirubin: 0.9 mg/dL (ref 0.0–1.2)
Total Protein: 6.4 g/dL — ABNORMAL LOW (ref 6.5–8.1)

## 2024-06-18 LAB — ETHANOL: Alcohol, Ethyl (B): 283 mg/dL — ABNORMAL HIGH (ref <15)

## 2024-06-18 LAB — I-STAT CG4 LACTIC ACID, ED: Lactic Acid, Venous: 2.3 mmol/L (ref 0.5–1.9)

## 2024-06-18 MED ORDER — LACTATED RINGERS IV BOLUS
1000.0000 mL | Freq: Once | INTRAVENOUS | Status: AC
  Administered 2024-06-18: 1000 mL via INTRAVENOUS

## 2024-06-18 MED ORDER — MORPHINE SULFATE (PF) 4 MG/ML IV SOLN
4.0000 mg | Freq: Once | INTRAVENOUS | Status: AC
  Administered 2024-06-18: 4 mg via INTRAVENOUS
  Filled 2024-06-18: qty 1

## 2024-06-18 MED ORDER — IOHEXOL 350 MG/ML SOLN
75.0000 mL | Freq: Once | INTRAVENOUS | Status: AC | PRN
  Administered 2024-06-18: 75 mL via INTRAVENOUS

## 2024-06-18 MED ORDER — ONDANSETRON HCL 4 MG/2ML IJ SOLN
4.0000 mg | Freq: Once | INTRAMUSCULAR | Status: AC
  Administered 2024-06-18: 4 mg via INTRAVENOUS
  Filled 2024-06-18: qty 2

## 2024-06-18 NOTE — ED Notes (Signed)
 BIB [facility] from home, patient with 20 ft fall from ladder onto right hip, patient unable to walk since. 100 mcg fentanyl  via EMS.

## 2024-06-18 NOTE — Consult Note (Signed)
 Brief orthopedic consult note: I was called by EDP for this patient.  He was intoxicated and on a ladder and fell.  Imaging in the emergency department demonstrated right sided pelvic fractures that are minimally displaced.  I have asked that the CT scan be reformatted for a bony pelvis.  He will be touchdown weightbearing on the right side and plan for nonoperative treatment.  He may follow-up with me as an outpatient.  If he requires admission for pain control, ED will call hospitalist versus trauma and orthopedics can formally consult at that time.

## 2024-06-18 NOTE — Progress Notes (Signed)
 Orthopedic Tech Progress Note Patient Details:  Timothy Estrada 16-Nov-1971 [id number]  Patient ID: Timothy Estrada, male   DOB: 1971/03/24, 53 y.o.   MRN: [id number] Level 2 trauma. Not needed at this time. Timothy Estrada 06/18/2024, 8:03 PM

## 2024-06-18 NOTE — ED Triage Notes (Signed)
 Patient arrived with EMS wearing C-collar  level 2 trauma activation , fell from a ladder approx. 20 feet high this evening , landed on his right side , no LOC , reports pain at right hip / right thigh. No anticoagulant medication .

## 2024-06-18 NOTE — ED Notes (Signed)
 Patient transported to CT scan .

## 2024-06-18 NOTE — ED Notes (Signed)
 Trauma Response Nurse Documentation   Timothy Estrada is a 53 y.o. male arriving to [facility] ED via [facility]  On No antithrombotic. Trauma was activated as a Level 2 by Almarie Naomi PEAK based on the following trauma criteria Falls > 20 ft. with adults, >10 ft. with children (<15).  Patient cleared for CT by Dr. Patt. Pt transported to CT with trauma response nurse present to monitor. RN remained with the patient throughout their absence from the department for clinical observation.   GCS 15.  Trauma MD Arrival Time: N/A.  History   Past Medical History:  Diagnosis Date   Medical history non-contributory      Past Surgical History:  Procedure Laterality Date   FRACTURE SURGERY     Hx of right ankle   ORIF CLAVICULAR FRACTURE Right 04/29/2013   Procedure: OPEN REDUCTION INTERNAL FIXATION (ORIF) CLAVICULAR FRACTURE;  Surgeon: Ozell VEAR Bruch, MD;  Location: [facility] OR;  Service: Orthopedics;  Laterality: Right;  Aspiration of right breast mass,? sebaceaous cyst       Initial Focused Assessment (If applicable, or please see trauma documentation): Airway-- intact, no visible obstruction Breathing-- spontaneous, unlabored Circulation-- no obvious bleeding noted  CT's Completed:   CT Head, CT Maxillofacial, CT C-Spine, CT Chest w/ contrast, and CT abdomen/pelvis w/ contrast   Interventions:  See event summary  Plan for disposition:  Admission to floor   Consults completed:  Orthopaedic Surgeon at 2139. Trauma Surgeon at 2155.  Event Summary: Patient brought in by [facility] from home, patient sustained a fall from ladder approx. 20 feet. Patient complaint of right hip pain, unable to walk since fall. On arrival to department patient transferred from EMS stretcher to hospital stretcher. Manual BP obtained. Trauma labs obtained. FAST negative. Xray chest, pelvis, left femur, right hip completed. 4 mg morphine, 4 mg zofran  administered. Patient log-rolled by team,  denies back pain. Patient to CT with TRN, primary RN. CT head, c-spine, chest/abdomen/pelvis, maxillofacial completed. Patient back to trauma bay at this time.  MTP Summary (If applicable):   Bedside handoff with ED RN Beryl.    Bernardino Mayotte  Trauma Response RN  Please call TRN at [phone] for further assistance.

## 2024-06-18 NOTE — ED Provider Notes (Signed)
 [place] EMERGENCY DEPARTMENT AT [facility] Provider Note   CSN: [id number] Arrival date & time: 06/18/24  1949     Patient presents with: Fall / Level 2   Timothy Estrada is a 53 y.o. male history of alcohol use here presenting with alcohol intoxication and fall.  Patient states that he did drink some alcohol today.  He states that he was on top of a 20 foot ladder trying to clean the gutters.  He accidentally fell off and landed on the right side.  He states that he had a hard time walking afterwards.  Patient did have a fall several days ago and states that he bruised his left leg.   The history is provided by the patient.       Prior to Admission medications   Medication Sig Start Date End Date Taking? Authorizing Provider  hydrOXYzine  (ATARAX ) 25 MG tablet Take 1 tablet (25 mg total) by mouth 3 (three) times daily as needed for anxiety. 06/16/23   Rollene Katz, MD  traZODone  (DESYREL ) 50 MG tablet Take 1 tablet (50 mg total) by mouth at bedtime as needed for sleep. 06/16/23 07/16/23  Rollene Katz, MD    Allergies: Patient has no known allergies.    Review of Systems  Musculoskeletal:        Right hip pain  All other systems reviewed and are negative.   Updated Vital Signs BP (!) 140/98   Pulse 70   Temp (!) 97 F (36.1 C) (Temporal)   Resp 17   SpO2 96%   Physical Exam Vitals and nursing note reviewed.  Constitutional:      Comments: Intoxicated  HENT:     Head: Normocephalic.     Comments: No obvious scalp laceration    Nose: Nose normal.     Mouth/Throat:     Mouth: Mucous membranes are moist.  Eyes:     Extraocular Movements: Extraocular movements intact.     Pupils: Pupils are equal, round, and reactive to light.  Neck:     Comments: C-collar in place Cardiovascular:     Rate and Rhythm: Normal rate and regular rhythm.     Pulses: Normal pulses.     Heart sounds: Normal heart sounds.  Pulmonary:     Effort: Pulmonary effort is  normal.     Breath sounds: Normal breath sounds.  Abdominal:     General: Abdomen is flat.     Palpations: Abdomen is soft.  Musculoskeletal:     Cervical back: Normal range of motion.     Comments: Has mild right hip tenderness.  Patient has bruising on the left thigh  Skin:    General: Skin is warm.     Capillary Refill: Capillary refill takes less than 2 seconds.  Neurological:     General: No focal deficit present.     Mental Status: He is oriented to person, place, and time.     Comments: Intoxicated  but moving all extremity  Psychiatric:        Mood and Affect: Mood normal.     (all labs ordered are listed, but only abnormal results are displayed) Labs Reviewed  CBC WITH DIFFERENTIAL/PLATELET - Abnormal; Notable for the following components:      Result Value   WBC 11.5 (*)    Abs Immature Granulocytes 0.22 (*)    All other components within normal limits  COMPREHENSIVE METABOLIC PANEL WITH GFR - Abnormal; Notable for the following components:   Calcium  8.6 (*)    Total Protein 6.4 (*)    AST 99 (*)    ALT 82 (*)    All other components within normal limits  ETHANOL - Abnormal; Notable for the following components:   Alcohol, Ethyl (B) 283 (*)    All other components within normal limits  I-STAT CHEM 8, ED - Abnormal; Notable for the following components:   Potassium 3.4 (*)    Creatinine, Ser 1.30 (*)    Calcium, Ion 1.03 (*)    All other components within normal limits  I-STAT CG4 LACTIC ACID, ED - Abnormal; Notable for the following components:   Lactic Acid, Venous 2.3 (*)    All other components within normal limits    EKG: None  Radiology: CT CHEST ABDOMEN PELVIS W CONTRAST Result Date: 06/18/2024 CLINICAL DATA:  Polytrauma, blunt.  Fall level 2. EXAM: CT CHEST, ABDOMEN, AND PELVIS WITH CONTRAST TECHNIQUE: Multidetector CT imaging of the chest, abdomen and pelvis was performed following the standard protocol during bolus administration of intravenous  contrast. RADIATION DOSE REDUCTION: This exam was performed according to the departmental dose-optimization program which includes automated exposure control, adjustment of the mA and/or kV according to patient size and/or use of iterative reconstruction technique. CONTRAST:  75mL OMNIPAQUE  IOHEXOL  350 MG/ML SOLN COMPARISON:  CT chest 08/30/2021 FINDINGS: CHEST: Cardiovascular: No aortic injury. The thoracic aorta is normal in caliber. The heart is normal in size. No significant pericardial effusion. Mediastinum/Nodes: No pneumomediastinum. No mediastinal hematoma. The esophagus is unremarkable. The thyroid  is unremarkable. The central airways are patent. No mediastinal, hilar, or axillary lymphadenopathy. Lungs/Pleura: Paraseptal emphysematous changes. No focal consolidation. No pulmonary nodule. No pulmonary mass. No pulmonary contusion or laceration. No pneumatocele formation. No pleural effusion. No pneumothorax. No hemothorax. Musculoskeletal/Chest wall: Right chest wall 6.6 x 3.8 cm fluid density lesion that abuts the pectoralis muscle. No acute rib or sternal fracture. No spinal fracture. Old healed right posterior first rib fracture. Plate and screw fixation of an old healed right clavicular fracture. ABDOMEN / PELVIS: Hepatobiliary: Not enlarged. No focal lesion. No laceration or subcapsular hematoma. The gallbladder is otherwise unremarkable with no radio-opaque gallstones. No biliary ductal dilatation. Pancreas: Normal pancreatic contour. No main pancreatic duct dilatation. Spleen: Not enlarged. No focal lesion. No laceration, subcapsular hematoma, or vascular injury. Adrenals/Urinary Tract: No nodularity bilaterally. Bilateral kidneys enhance symmetrically. No hydronephrosis. No contusion, laceration, or subcapsular hematoma. Bilateral pericentimeter fluid density lesions likely represent simple renal cysts. Simple renal cysts, in the absence of clinically indicated signs/symptoms, require no independent  follow-up. No injury to the vascular structures or collecting systems. No hydroureter. The urinary bladder is unremarkable. On delayed imaging, there is no urothelial wall thickening and there are no filling defects in the opacified portions of the bilateral collecting systems or ureters. Stomach/Bowel: No small or large bowel wall thickening or dilatation. The appendix is unremarkable. Vasculature/Lymphatics: Moderate atherosclerotic plaque. No abdominal aorta or iliac aneurysm. No active contrast extravasation or pseudoaneurysm. No abdominal, pelvic, inguinal lymphadenopathy. Reproductive: Prostate is unremarkable. Other: No simple free fluid ascites. No pneumoperitoneum. No hemoperitoneum. No mesenteric hematoma identified. No organized fluid collection. Musculoskeletal: No significant soft tissue hematoma. Tiny fat containing umbilical hernia. Acute displaced right inferior and superior pubic rami fractures extending to the right anterior acetabular wall and pubic symphysis. Acute nondisplaced left inferior and superior pubic rami. Acute displaced right sacral ala fracture. No associated pelvic diastasis. Asymmetric slightly enlarged and heterogeneous right obturator internus and externus suggestive underlying muscular hematoma. Grade  1 anterolisthesis of L5 on S1. Other ports and devices: None. IMPRESSION: 1. Acute displaced right inferior and superior pubic rami fractures extending to the right anterior acetabular wall and pubic symphysis. Asymmetric slightly enlarged and heterogeneous right obturator internus and externus suggestive underlying muscular hematoma. 2. Acute nondisplaced left inferior and superior pubic rami. 3. Acute displaced right sacral ala fracture. 4. No acute intrathoracic, intra-abdominal, intrapelvic traumatic injury. 5. No acute fracture or traumatic malalignment of the thoracic or lumbar spine. 6. Right chest wall 6.6 x 3.8 cm fluid density lesion that abuts the pectoralis muscle.  Correlate with physical exam. Consider diagnostic mammography if clinically indicated. 7. Aortic Atherosclerosis (ICD10-I70.0) and Emphysema (ICD10-J43.9). Electronically Signed   By: Morgane  Naveau M.D.   On: 06/18/2024 21:05   CT HEAD WO CONTRAST Result Date: 06/18/2024 CLINICAL DATA:  Head trauma, moderate-severe; Polytrauma, blunt; Facial trauma, blunt EXAM: CT HEAD WITHOUT CONTRAST CT MAXILLOFACIAL WITHOUT CONTRAST CT CERVICAL SPINE WITHOUT CONTRAST TECHNIQUE: Multidetector CT imaging of the head, cervical spine, and maxillofacial structures were performed using the standard protocol without intravenous contrast. Multiplanar CT image reconstructions of the cervical spine and maxillofacial structures were also generated. RADIATION DOSE REDUCTION: This exam was performed according to the departmental dose-optimization program which includes automated exposure control, adjustment of the mA and/or kV according to patient size and/or use of iterative reconstruction technique. COMPARISON:  None Available. FINDINGS: CT HEAD FINDINGS Brain: Patchy and confluent areas of decreased attenuation are noted throughout the deep and periventricular white matter of the cerebral hemispheres bilaterally, compatible with chronic microvascular ischemic disease. No evidence of large-territorial acute infarction. No parenchymal hemorrhage. No mass lesion. No extra-axial collection. No mass effect or midline shift. No hydrocephalus. Basilar cisterns are patent. Vascular: No hyperdense vessel. Skull: No acute fracture or focal lesion. Other: None. CT MAXILLOFACIAL FINDINGS Osseous: No fracture or mandibular dislocation. No destructive process. Sinuses/Orbits: Bilateral maxillary sinus mucosal thickening. Otherwise paranasal sinuses and mastoid air cells are clear. The orbits are unremarkable. Soft tissues: Negative. CT CERVICAL SPINE FINDINGS Alignment: Normal. Skull base and vertebrae: No acute fracture. No aggressive appearing  focal osseous lesion or focal pathologic process. Soft tissues and spinal canal: No prevertebral fluid or swelling. No visible canal hematoma. Upper chest: Paraseptal emphysematous changes. Other: None. IMPRESSION: 1. No acute intracranial abnormality. 2.  No acute displaced facial fracture. 3. No acute displaced fracture or traumatic listhesis of the cervical spine. 4.  Emphysema (ICD10-J43.9). Electronically Signed   By: Morgane  Naveau M.D.   On: 06/18/2024 20:47   CT MAXILLOFACIAL WO CONTRAST Result Date: 06/18/2024 CLINICAL DATA:  Head trauma, moderate-severe; Polytrauma, blunt; Facial trauma, blunt EXAM: CT HEAD WITHOUT CONTRAST CT MAXILLOFACIAL WITHOUT CONTRAST CT CERVICAL SPINE WITHOUT CONTRAST TECHNIQUE: Multidetector CT imaging of the head, cervical spine, and maxillofacial structures were performed using the standard protocol without intravenous contrast. Multiplanar CT image reconstructions of the cervical spine and maxillofacial structures were also generated. RADIATION DOSE REDUCTION: This exam was performed according to the departmental dose-optimization program which includes automated exposure control, adjustment of the mA and/or kV according to patient size and/or use of iterative reconstruction technique. COMPARISON:  None Available. FINDINGS: CT HEAD FINDINGS Brain: Patchy and confluent areas of decreased attenuation are noted throughout the deep and periventricular white matter of the cerebral hemispheres bilaterally, compatible with chronic microvascular ischemic disease. No evidence of large-territorial acute infarction. No parenchymal hemorrhage. No mass lesion. No extra-axial collection. No mass effect or midline shift. No hydrocephalus. Basilar cisterns are patent. Vascular: No  hyperdense vessel. Skull: No acute fracture or focal lesion. Other: None. CT MAXILLOFACIAL FINDINGS Osseous: No fracture or mandibular dislocation. No destructive process. Sinuses/Orbits: Bilateral maxillary sinus  mucosal thickening. Otherwise paranasal sinuses and mastoid air cells are clear. The orbits are unremarkable. Soft tissues: Negative. CT CERVICAL SPINE FINDINGS Alignment: Normal. Skull base and vertebrae: No acute fracture. No aggressive appearing focal osseous lesion or focal pathologic process. Soft tissues and spinal canal: No prevertebral fluid or swelling. No visible canal hematoma. Upper chest: Paraseptal emphysematous changes. Other: None. IMPRESSION: 1. No acute intracranial abnormality. 2.  No acute displaced facial fracture. 3. No acute displaced fracture or traumatic listhesis of the cervical spine. 4.  Emphysema (ICD10-J43.9). Electronically Signed   By: Morgane  Naveau M.D.   On: 06/18/2024 20:47   CT CERVICAL SPINE WO CONTRAST Result Date: 06/18/2024 CLINICAL DATA:  Head trauma, moderate-severe; Polytrauma, blunt; Facial trauma, blunt EXAM: CT HEAD WITHOUT CONTRAST CT MAXILLOFACIAL WITHOUT CONTRAST CT CERVICAL SPINE WITHOUT CONTRAST TECHNIQUE: Multidetector CT imaging of the head, cervical spine, and maxillofacial structures were performed using the standard protocol without intravenous contrast. Multiplanar CT image reconstructions of the cervical spine and maxillofacial structures were also generated. RADIATION DOSE REDUCTION: This exam was performed according to the departmental dose-optimization program which includes automated exposure control, adjustment of the mA and/or kV according to patient size and/or use of iterative reconstruction technique. COMPARISON:  None Available. FINDINGS: CT HEAD FINDINGS Brain: Patchy and confluent areas of decreased attenuation are noted throughout the deep and periventricular white matter of the cerebral hemispheres bilaterally, compatible with chronic microvascular ischemic disease. No evidence of large-territorial acute infarction. No parenchymal hemorrhage. No mass lesion. No extra-axial collection. No mass effect or midline shift. No hydrocephalus.  Basilar cisterns are patent. Vascular: No hyperdense vessel. Skull: No acute fracture or focal lesion. Other: None. CT MAXILLOFACIAL FINDINGS Osseous: No fracture or mandibular dislocation. No destructive process. Sinuses/Orbits: Bilateral maxillary sinus mucosal thickening. Otherwise paranasal sinuses and mastoid air cells are clear. The orbits are unremarkable. Soft tissues: Negative. CT CERVICAL SPINE FINDINGS Alignment: Normal. Skull base and vertebrae: No acute fracture. No aggressive appearing focal osseous lesion or focal pathologic process. Soft tissues and spinal canal: No prevertebral fluid or swelling. No visible canal hematoma. Upper chest: Paraseptal emphysematous changes. Other: None. IMPRESSION: 1. No acute intracranial abnormality. 2.  No acute displaced facial fracture. 3. No acute displaced fracture or traumatic listhesis of the cervical spine. 4.  Emphysema (ICD10-J43.9). Electronically Signed   By: Morgane  Naveau M.D.   On: 06/18/2024 20:47   DG Hip [place] or [place] Pelvis 1 View Right Result Date: 06/18/2024 EXAM: 3 VIEW XRAY OF THE RIGHT HIP 06/18/2024 08:14:36 PM COMPARISON: None available. CLINICAL HISTORY: Fall. FINDINGS: BONES AND JOINTS: No acute fracture or focal osseous lesion. The hip joint is maintained. No significant degenerative changes. SOFT TISSUES: The soft tissues are unremarkable. IMPRESSION: 1. Negative. Electronically signed by: Pinkie Pebbles MD 06/18/2024 08:25 PM EDT RP Workstation: [id number]   DG Pelvis Portable Result Date: 06/18/2024 EXAM: 2 VIEW(S) XRAY OF THE PELVIS 06/18/2024 08:14:36 PM COMPARISON: None available. CLINICAL HISTORY: Trauma. FINDINGS: BONES AND JOINTS: No acute fracture. No focal osseous lesion. No joint dislocation. SOFT TISSUES: The soft tissues are unremarkable. IMPRESSION: 1. Negative. Electronically signed by: Pinkie Pebbles MD 06/18/2024 08:24 PM EDT RP Workstation: [id number]   DG Chest Port 1 View Result Date:  06/18/2024 EXAM: 1 VIEW XRAY OF THE CHEST 06/18/2024 08:14:36 PM COMPARISON: CT chest dated 08/30/2021. CLINICAL HISTORY:  Trauma. FINDINGS: LUNGS AND PLEURA: No focal pulmonary opacity. No pulmonary edema. No pleural effusion. No pneumothorax. HEART AND MEDIASTINUM: No acute abnormality of the cardiac and mediastinal silhouettes. BONES AND SOFT TISSUES: No acute osseous abnormality. Status post orif of the right clavicl. Old right lateral fifth rib fracture deformity. IMPRESSION: 1. No acute process. Electronically signed by: Pinkie Pebbles MD 06/18/2024 08:24 PM EDT RP Workstation: [id number]   DG Femur Min 2 Views Left Result Date: 06/18/2024 EXAM: 2 VIEW(S) XRAY OF THE LEFT FEMUR 06/18/2024 08:14:36 PM COMPARISON: None available. CLINICAL HISTORY: Fall. FINDINGS: BONES AND JOINTS: No acute fracture. No focal osseous lesion. No joint dislocation. SOFT TISSUES: The soft tissues are unremarkable. IMPRESSION: 1. Negative. Electronically signed by: Pinkie Pebbles MD 06/18/2024 08:23 PM EDT RP Workstation: [id number]     Procedures   CRITICAL CARE Performed by: Alm VEAR Cave   Total critical care time: 30 minutes  Critical care time was exclusive of separately billable procedures and treating other patients.  Critical care was necessary to treat or prevent imminent or life-threatening deterioration.  Critical care was time spent personally by me on the following activities: development of treatment plan with patient and/or surrogate as well as nursing, discussions with consultants, evaluation of patient's response to treatment, examination of patient, obtaining history from patient or surrogate, ordering and performing treatments and interventions, ordering and review of laboratory studies, ordering and review of radiographic studies, pulse oximetry and re-evaluation of patient's condition.   EMERGENCY DEPARTMENT US  FAST EXAM Limited Ultrasound of the Abdomen and Pericardium (FAST  Exam).   INDICATIONS:Blunt injury of abdomen Multiple views of the abdomen and pericardium are obtained with a multi-frequency probe.  PERFORMED BY: Myself IMAGES ARCHIVED?: Yes LIMITATIONS:  Emergent procedure INTERPRETATION:  No abdominal free fluid   Medications Ordered in the ED  morphine (PF) 4 MG/ML injection 4 mg (has no administration in time range)  lactated ringers  bolus 1,000 mL (0 mLs Intravenous Stopped 06/18/24 2045)  morphine (PF) 4 MG/ML injection 4 mg (4 mg Intravenous Given 06/18/24 2007)  ondansetron  (ZOFRAN ) injection 4 mg (4 mg Intravenous Given 06/18/24 2007)  iohexol  (OMNIPAQUE ) 350 MG/ML injection 75 mL (75 mLs Intravenous Contrast Given 06/18/24 2026)                                    Medical Decision Making Timothy Estrada is a 53 y.o. male here presenting with fall.  Patient fell off of a 20 foot ladder.  Patient landed on the right hip.  Concern for hip fracture versus pelvic fracture.  Plan to do trauma scan with CT head and cervical spine and CT chest abdomen pelvis.  10:24 PM I reviewed patient's labs and independently interpreted imaging studies.  Patient's lactate is elevated at 2.3.  Alcohol level is 283.  Trauma scan showed pelvic fractures and also acetabular fractures and also muscular hematoma and pubic rami fractures.  Discussed with Dr. Elsa from Ortho surgery.  He states that the injuries did not require surgery.  Just pain control and rehab.  He states that he will see patient as a consult.  Unfortunately patient is unable to walk or bear weight on the leg.  I discussed with Dr. Paola from trauma to admit the patient  Problems Addressed: Closed bilateral fracture of pubic rami, initial encounter [facility]): acute illness or injury Closed displaced fracture of right acetabulum, unspecified portion of acetabulum, initial encounter [facility]):  acute illness or injury  Amount and/or Complexity of Data Reviewed Labs: ordered. Decision-making details documented  in ED Course. Radiology: ordered and independent interpretation performed. Decision-making details documented in ED Course.  Risk Prescription drug management. Decision regarding hospitalization.     Final diagnoses:  None    ED Discharge Orders     None          Patt Alm Macho, MD 06/18/24 2227

## 2024-06-19 ENCOUNTER — Observation Stay (HOSPITAL_COMMUNITY): Payer: Self-pay

## 2024-06-19 DIAGNOSIS — W19XXXA Unspecified fall, initial encounter: Secondary | ICD-10-CM | POA: Diagnosis present

## 2024-06-19 LAB — CBC
HCT: 36.1 % — ABNORMAL LOW (ref 39.0–52.0)
Hemoglobin: 12.2 g/dL — ABNORMAL LOW (ref 13.0–17.0)
MCH: 32 pg (ref 26.0–34.0)
MCHC: 33.8 g/dL (ref 30.0–36.0)
MCV: 94.8 fL (ref 80.0–100.0)
Platelets: 190 K/uL (ref 150–400)
RBC: 3.81 MIL/uL — ABNORMAL LOW (ref 4.22–5.81)
RDW: 13.7 % (ref 11.5–15.5)
WBC: 9.6 K/uL (ref 4.0–10.5)
nRBC: 0 % (ref 0.0–0.2)

## 2024-06-19 LAB — BASIC METABOLIC PANEL WITH GFR
Anion gap: 10 (ref 5–15)
BUN: 8 mg/dL (ref 6–20)
CO2: 26 mmol/L (ref 22–32)
Calcium: 8.5 mg/dL — ABNORMAL LOW (ref 8.9–10.3)
Chloride: 106 mmol/L (ref 98–111)
Creatinine, Ser: 0.98 mg/dL (ref 0.61–1.24)
GFR, Estimated: >60 mL/min (ref >60)
Glucose, Bld: 86 mg/dL (ref 70–99)
Potassium: 3.8 mmol/L (ref 3.5–5.1)
Sodium: 142 mmol/L (ref 135–145)

## 2024-06-19 LAB — HIV ANTIBODY (ROUTINE TESTING W REFLEX): HIV Screen 4th Generation wRfx: NONREACTIVE

## 2024-06-19 MED ORDER — IPRATROPIUM-ALBUTEROL 0.5-2.5 (3) MG/3ML IN SOLN
RESPIRATORY_TRACT | Status: AC
  Administered 2024-06-19: 3 mL
  Filled 2024-06-19: qty 3

## 2024-06-19 MED ORDER — ACETAMINOPHEN 500 MG PO TABS
1000.0000 mg | ORAL_TABLET | Freq: Four times a day (QID) | ORAL | Status: DC
  Administered 2024-06-19 – 2024-06-21 (×11): 1000 mg via ORAL
  Filled 2024-06-19 (×10): qty 2

## 2024-06-19 MED ORDER — OXYCODONE HCL 5 MG PO TABS
5.0000 mg | ORAL_TABLET | ORAL | Status: DC | PRN
  Administered 2024-06-19 – 2024-06-21 (×11): 10 mg via ORAL
  Administered 2024-06-21: 5 mg via ORAL
  Filled 2024-06-19 (×12): qty 2

## 2024-06-19 MED ORDER — HYDRALAZINE HCL 20 MG/ML IJ SOLN: 10.0000 mg | INTRAMUSCULAR | Status: DC | PRN

## 2024-06-19 MED ORDER — IBUPROFEN 200 MG PO TABS
800.0000 mg | ORAL_TABLET | Freq: Three times a day (TID) | ORAL | Status: DC
  Administered 2024-06-19 – 2024-06-21 (×9): 800 mg via ORAL
  Filled 2024-06-19: qty 1
  Filled 2024-06-19 (×8): qty 4

## 2024-06-19 MED ORDER — SPIRITUS FRUMENTI
1.0000 | Freq: Two times a day (BID) | ORAL | Status: DC
  Administered 2024-06-19 – 2024-06-21 (×5): 1 via ORAL
  Filled 2024-06-19 (×6): qty 1

## 2024-06-19 MED ORDER — MORPHINE SULFATE (PF) 4 MG/ML IV SOLN
4.0000 mg | INTRAVENOUS | Status: DC | PRN
  Administered 2024-06-19 (×2): 4 mg via INTRAVENOUS
  Filled 2024-06-19 (×2): qty 1

## 2024-06-19 MED ORDER — POLYETHYLENE GLYCOL 3350 17 G PO PACK: 17.0000 g | PACK | Freq: Every day | ORAL | Status: DC | PRN

## 2024-06-19 MED ORDER — DOCUSATE SODIUM 100 MG PO CAPS
100.0000 mg | ORAL_CAPSULE | Freq: Two times a day (BID) | ORAL | Status: DC
  Administered 2024-06-19 – 2024-06-21 (×6): 100 mg via ORAL
  Filled 2024-06-19 (×6): qty 1

## 2024-06-19 MED ORDER — ONDANSETRON HCL 4 MG/2ML IJ SOLN: 4.0000 mg | Freq: Four times a day (QID) | INTRAMUSCULAR | Status: DC | PRN

## 2024-06-19 MED ORDER — METOPROLOL TARTRATE 5 MG/5ML IV SOLN: 5.0000 mg | Freq: Four times a day (QID) | INTRAVENOUS | Status: DC | PRN

## 2024-06-19 MED ORDER — LACTATED RINGERS IV SOLN: INTRAVENOUS | Status: DC

## 2024-06-19 MED ORDER — METHOCARBAMOL 500 MG PO TABS
1000.0000 mg | ORAL_TABLET | Freq: Three times a day (TID) | ORAL | Status: DC
  Administered 2024-06-19 – 2024-06-21 (×8): 1000 mg via ORAL
  Filled 2024-06-19 (×8): qty 2

## 2024-06-19 MED ORDER — ONDANSETRON 4 MG PO TBDP: 4.0000 mg | ORAL_TABLET | Freq: Four times a day (QID) | ORAL | Status: DC | PRN

## 2024-06-19 MED ORDER — ENOXAPARIN SODIUM 30 MG/0.3ML IJ SOSY
30.0000 mg | PREFILLED_SYRINGE | Freq: Two times a day (BID) | INTRAMUSCULAR | Status: DC
  Administered 2024-06-20 – 2024-06-21 (×3): 30 mg via SUBCUTANEOUS
  Filled 2024-06-19 (×3): qty 0.3

## 2024-06-19 NOTE — ED Notes (Signed)
 Assuming care of this pt at this time. Report from [place], [place]

## 2024-06-19 NOTE — Progress Notes (Signed)
 OT Cancellation Note  Patient Details Name: Timothy Estrada MRN: [id number] DOB: [birth year]   Cancelled Treatment:    Reason Eval/Treat Not Completed: Other (comment) (awaiting xray results.)  [name] FREDERICK, OTR/L [facility] Office: [phone]   [name] 06/19/2024, 9:12 AM

## 2024-06-19 NOTE — Evaluation (Signed)
 Occupational Therapy Evaluation Patient Details Name: Timothy Estrada MRN: [id number] DOB: 12/08/1970 Today's Date: 06/19/2024   History of Present Illness   53 y.o. male arriving to [facility] ED via [facility] 06/18/24 after 20 ft fall from ladder onto rt side. +ETOH; CT head, c-spine, chest, abdomen/pelvis +bil pubic rami and R acetabular fxs;     Clinical Impressions PTA, pt lived with sister and reports being fully independent. Upon eval, pt reporting he may be able to stay with other family/friends while he heals in light of current living situation being 3rd floor apartment. Pt currently limited by pain, decr ROM, strength, balance, and knowledge of precautions. Pt needing up to Min A for mobility and ADL. Will continue to follow. Recommending HHOT for follow up now; pending progression, may not need follow up.     If plan is discharge home, recommend the following:   A little help with walking and/or transfers;A little help with bathing/dressing/bathroom;Assistance with cooking/housework;Assist for transportation;Help with stairs or ramp for entrance     Functional Status Assessment   Patient has had a recent decline in their functional status and demonstrates the ability to make significant improvements in function in a reasonable and predictable amount of time.     Equipment Recommendations   Tub/shower bench     Recommendations for Other Services         Precautions/Restrictions   Precautions Precautions: Fall Recall of Precautions/Restrictions: Intact Precaution/Restrictions Comments: no hip precautions per Dr. Elsa Restrictions Weight Bearing Restrictions Per Provider Order: Yes RLE Weight Bearing Per Provider Order: Touchdown weight bearing     Mobility Bed Mobility Overal bed mobility: Needs Assistance Bed Mobility: Supine to Sit     Supine to sit: Contact guard, HOB elevated     General bed mobility comments: HOB ~30; pt moved slowly  but no physical assist    Transfers Overall transfer level: Needs assistance Equipment used: Rolling walker (2 wheels) Transfers: Sit to/from Stand Sit to Stand: Min assist, +2 safety/equipment           General transfer comment: pt pushing up with one hand on RW (stabilized by PT) with min boosting assist      Balance Overall balance assessment: Mild deficits observed, not formally tested                                         ADL either performed or assessed with clinical judgement   ADL Overall ADL's : Needs assistance/impaired Eating/Feeding: Modified independent;Sitting   Grooming: Set up;Sitting   Upper Body Bathing: Set up;Sitting   Lower Body Bathing: Sit to/from stand;Minimal assistance   Upper Body Dressing : Set up;Sitting   Lower Body Dressing: Moderate assistance;Sit to/from stand   Toilet Transfer: Contact guard assist;Ambulation;Rolling walker (2 wheels)           Functional mobility during ADLs: Contact guard assist;Rolling walker (2 wheels)       Vision Patient Visual Report: No change from baseline       Perception         Praxis         Pertinent Vitals/Pain Pain Assessment Pain Assessment: 0-10 Pain Score: 10-Worst pain ever Pain Location: RLE Pain Descriptors / Indicators: Cramping, Discomfort, Grimacing, Guarding Pain Intervention(s): Limited activity within patient's tolerance, Monitored during session     Extremity/Trunk Assessment Upper Extremity Assessment Upper Extremity Assessment: Overall  WFL for tasks assessed   Lower Extremity Assessment Lower Extremity Assessment: Defer to PT evaluation RLE Deficits / Details: AROM WFL but moves very slowly, guarded   Cervical / Trunk Assessment Cervical / Trunk Assessment: Normal   Communication Communication Communication: Impaired Factors Affecting Communication: Hearing impaired   Cognition Arousal: Alert Behavior During Therapy: WFL for tasks  assessed/performed Cognition: No apparent impairments             OT - Cognition Comments: following commands WFL, is oriented, maintains precautions without additional cues. Executive function not formally assessed                 Following commands: Intact       Cueing  General Comments   Cueing Techniques: Verbal cues  VSS   Exercises     Shoulder Instructions      Home Living Family/patient expects to be discharged to:: Private residence Living Arrangements: Other relatives (SISTER)   Type of Home: Apartment Home Access: Stairs to enter [employer] of Steps: 3 flights Entrance Stairs-Rails: Right;Left Home Layout: One level     Bathroom Shower/Tub: Chief Strategy Officer: Standard     Home Equipment: None   Additional Comments: pt hoping to find someone to stay with that has fewer steps to enter      Prior Functioning/Environment Prior Level of Function : Independent/Modified Independent;Working/employed;Driving [employer], [employer])                    OT Problem List: Decreased strength;Decreased range of motion;Decreased activity tolerance;Impaired balance (sitting and/or standing);Decreased knowledge of use of DME or AE;Decreased knowledge of precautions;Pain   OT Treatment/Interventions: Self-care/ADL training;Therapeutic exercise;DME and/or AE instruction;Therapeutic activities;Cognitive remediation/compensation;Patient/family education;Balance training      OT Goals(Current goals can be found in the care plan section)   Acute Rehab OT Goals Patient Stated Goal: get better OT Goal Formulation: With patient Time For Goal Achievement: 07/03/24 Potential to Achieve Goals: Good   OT Frequency:  Min 2X/week    Co-evaluation PT/OT/SLP Co-Evaluation/Treatment: Yes Reason for Co-Treatment: Complexity of the patient's impairments (multi-system involvement);To address functional/ADL transfers;Other (comment) (pain level) PT  goals addressed during session: Mobility/safety with mobility;Balance;Proper use of DME OT goals addressed during session: ADL's and self-care      AM-PAC OT 6 Clicks Daily Activity     Outcome Measure Help from another person eating meals?: None Help from another person taking care of personal grooming?: A Little Help from another person toileting, which includes using toliet, bedpan, or urinal?: A Little Help from another person bathing (including washing, rinsing, drying)?: A Little Help from another person to put on and taking off regular upper body clothing?: A Little Help from another person to put on and taking off regular lower body clothing?: A Little 6 Click Score: 19   End of Session Equipment Utilized During Treatment: Gait belt;Rolling walker (2 wheels) Nurse Communication: Mobility status  Activity Tolerance: Patient tolerated treatment well Patient left: in chair;with call bell/phone within reach;with chair alarm set  OT Visit Diagnosis: Unsteadiness on feet (R26.81);Muscle weakness (generalized) (M62.81);Pain Pain - Right/Left: Right Pain - part of body: Hip                Time: 8883-8862 OT Time Calculation (min): 21 min Charges:  OT General Charges $OT Visit: 1 Visit OT Evaluation $OT Eval Low Complexity: 1 Low  [name] FREDERICK, OTR/L [facility] Acute Rehabilitation Office: [phone]   [name] 06/19/2024, 1:40  PM

## 2024-06-19 NOTE — Plan of Care (Signed)

## 2024-06-19 NOTE — H&P (Signed)
 Reason for Consult/Chief Complaint: poor pain control Consultant: Patt, MD  Timothy Estrada is an 54 y.o. male.   HPI: [name] was on a ladder, coming down off his roof from getting sticks off. Denies LOC or hitting his head. Noted pelvic fx, nonop per ortho, but pain control too poor to be discharged per EDP. Report B thigh pain R>L and pelvic pain, inproved since ED and getting pain medication.  Lives with parents and sister in [place]. No steps to enter and no steps inside the home.   1ppd, declines nicotine  patch, drinks a pint of vodka a day, no recreational drug use  Past Medical History:  Diagnosis Date   Medical history non-contributory     Past Surgical History:  Procedure Laterality Date   FRACTURE SURGERY     Hx of right ankle   ORIF CLAVICULAR FRACTURE Right 04/29/2013   Procedure: OPEN REDUCTION INTERNAL FIXATION (ORIF) CLAVICULAR FRACTURE;  Surgeon: Ozell VEAR Bruch, MD;  Location: [facility] OR;  Service: Orthopedics;  Laterality: Right;  Aspiration of right breast mass,? sebaceaous cyst    No family history on file.  Social History:  reports that he has been smoking cigarettes. He does not have any smokeless tobacco history on file. He reports current alcohol  use. He reports that he does not use drugs.  Allergies: No Known Allergies  Medications: I have reviewed the patient's current medications.  Results for orders placed or performed during the hospital encounter of 06/18/24 (from the past 48 hours)  I-stat chem 8, ED (not at [facility], [facility] or [facility])     Status: Abnormal   Collection Time: 06/18/24  7:58 PM  Result Value Ref Range   Sodium 143 135 - 145 mmol/L   Potassium 3.4 (L) 3.5 - 5.1 mmol/L   Chloride 107 98 - 111 mmol/L   BUN 10 6 - 20 mg/dL   Creatinine, Ser 8.69 (H) 0.61 - 1.24 mg/dL   Glucose, Bld 92 70 - 99 mg/dL    Comment: Glucose reference range applies only to samples taken after fasting for at least 8 hours.   Calcium, Ion 1.03 (L) 1.15 - 1.40 mmol/L   TCO2 23  22 - 32 mmol/L   Hemoglobin 14.6 13.0 - 17.0 g/dL   HCT 56.9 60.9 - 47.9 %  CBC with Differential     Status: Abnormal   Collection Time: 06/18/24  7:59 PM  Result Value Ref Range   WBC 11.5 (H) 4.0 - 10.5 K/uL   RBC 4.48 4.22 - 5.81 MIL/uL   Hemoglobin 14.1 13.0 - 17.0 g/dL   HCT 58.0 60.9 - 47.9 %   MCV 93.5 80.0 - 100.0 fL   MCH 31.5 26.0 - 34.0 pg   MCHC 33.7 30.0 - 36.0 g/dL   RDW 86.8 88.4 - 84.4 %   Platelets 256 150 - 400 K/uL   nRBC 0.0 0.0 - 0.2 %   Neutrophils Relative % 64 %   Neutro Abs 7.3 1.7 - 7.7 K/uL   Lymphocytes Relative 25 %   Lymphs Abs 2.9 0.7 - 4.0 K/uL   Monocytes Relative 7 %   Monocytes Absolute 0.8 0.1 - 1.0 K/uL   Eosinophils Relative 1 %   Eosinophils Absolute 0.2 0.0 - 0.5 K/uL   Basophils Relative 1 %   Basophils Absolute 0.1 0.0 - 0.1 K/uL   Immature Granulocytes 2 %   Abs Immature Granulocytes 0.22 (H) 0.00 - 0.07 K/uL    Comment: Performed at [facility]  [facility], [address], [place], [place] [address]  Comprehensive metabolic panel     Status: Abnormal   Collection Time: 06/18/24  7:59 PM  Result Value Ref Range   Sodium 143 135 - 145 mmol/L   Potassium 3.5 3.5 - 5.1 mmol/L   Chloride 107 98 - 111 mmol/L   CO2 23 22 - 32 mmol/L   Glucose, Bld 95 70 - 99 mg/dL    Comment: Glucose reference range applies only to samples taken after fasting for at least 8 hours.   BUN 9 6 - 20 mg/dL   Creatinine, Ser 9.13 0.61 - 1.24 mg/dL   Calcium 8.6 (L) 8.9 - 10.3 mg/dL   Total Protein 6.4 (L) 6.5 - 8.1 g/dL   Albumin 3.9 3.5 - 5.0 g/dL   AST 99 (H) 15 - 41 U/L   ALT 82 (H) 0 - 44 U/L   Alkaline Phosphatase 62 38 - 126 U/L   Total Bilirubin 0.9 0.0 - 1.2 mg/dL   GFR, Estimated >39 >39 mL/min    Comment: (NOTE) Calculated using the CKD-EPI Creatinine Equation (2021)    Anion gap 13 5 - 15    Comment: Performed at [facility], [address] [place], [place] [address]  I-Stat CG4 Lactic Acid, ED     Status: Abnormal   Collection Time:  06/18/24  7:59 PM  Result Value Ref Range   Lactic Acid, Venous 2.3 (HH) 0.5 - 1.9 mmol/L   Comment NOTIFIED PHYSICIAN   Ethanol     Status: Abnormal   Collection Time: 06/18/24  8:55 PM  Result Value Ref Range   Alcohol , Ethyl (B) 283 (H) <15 mg/dL    Comment: (NOTE) For medical purposes only. Performed at [facility], [address] [place], [place] [address]   HIV Antibody (routine testing w rflx)     Status: None   Collection Time: 06/19/24  1:07 AM  Result Value Ref Range   HIV Screen 4th Generation wRfx Non Reactive Non Reactive    Comment: Performed at [facility], [address], [place], [place] [address]  CBC     Status: Abnormal   Collection Time: 06/19/24  4:29 AM  Result Value Ref Range   WBC 9.6 4.0 - 10.5 K/uL   RBC 3.81 (L) 4.22 - 5.81 MIL/uL   Hemoglobin 12.2 (L) 13.0 - 17.0 g/dL   HCT 63.8 (L) 60.9 - 47.9 %   MCV 94.8 80.0 - 100.0 fL   MCH 32.0 26.0 - 34.0 pg   MCHC 33.8 30.0 - 36.0 g/dL   RDW 86.2 88.4 - 84.4 %   Platelets 190 150 - 400 K/uL   nRBC 0.0 0.0 - 0.2 %    Comment: Performed at [facility], [address], [place], [place] [address]    CT CHEST ABDOMEN PELVIS W CONTRAST Result Date: 06/18/2024 CLINICAL DATA:  Polytrauma, blunt.  Fall level 2. EXAM: CT CHEST, ABDOMEN, AND PELVIS WITH CONTRAST TECHNIQUE: Multidetector CT imaging of the chest, abdomen and pelvis was performed following the standard protocol during bolus administration of intravenous contrast. RADIATION DOSE REDUCTION: This exam was performed according to the departmental dose-optimization program which includes automated exposure control, adjustment of the mA and/or kV according to patient size and/or use of iterative reconstruction technique. CONTRAST:  75mL OMNIPAQUE  IOHEXOL  350 MG/ML SOLN COMPARISON:  CT chest 08/30/2021 FINDINGS: CHEST: Cardiovascular: No aortic injury. The thoracic aorta is normal in caliber.  The heart is normal in size. No significant  pericardial effusion. Mediastinum/Nodes: No pneumomediastinum. No mediastinal hematoma. The esophagus is unremarkable. The thyroid  is unremarkable. The central airways are patent. No mediastinal, hilar, or axillary lymphadenopathy. Lungs/Pleura: Paraseptal emphysematous changes. No focal consolidation. No pulmonary nodule. No pulmonary mass. No pulmonary contusion or laceration. No pneumatocele formation. No pleural effusion. No pneumothorax. No hemothorax. Musculoskeletal/Chest wall: Right chest wall 6.6 x 3.8 cm fluid density lesion that abuts the pectoralis muscle. No acute rib or sternal fracture. No spinal fracture. Old healed right posterior first rib fracture. Plate and screw fixation of an old healed right clavicular fracture. ABDOMEN / PELVIS: Hepatobiliary: Not enlarged. No focal lesion. No laceration or subcapsular hematoma. The gallbladder is otherwise unremarkable with no radio-opaque gallstones. No biliary ductal dilatation. Pancreas: Normal pancreatic contour. No main pancreatic duct dilatation. Spleen: Not enlarged. No focal lesion. No laceration, subcapsular hematoma, or vascular injury. Adrenals/Urinary Tract: No nodularity bilaterally. Bilateral kidneys enhance symmetrically. No hydronephrosis. No contusion, laceration, or subcapsular hematoma. Bilateral pericentimeter fluid density lesions likely represent simple renal cysts. Simple renal cysts, in the absence of clinically indicated signs/symptoms, require no independent follow-up. No injury to the vascular structures or collecting systems. No hydroureter. The urinary bladder is unremarkable. On delayed imaging, there is no urothelial wall thickening and there are no filling defects in the opacified portions of the bilateral collecting systems or ureters. Stomach/Bowel: No small or large bowel wall thickening or dilatation. The appendix is unremarkable. Vasculature/Lymphatics: Moderate atherosclerotic plaque. No abdominal aorta or iliac  aneurysm. No active contrast extravasation or pseudoaneurysm. No abdominal, pelvic, inguinal lymphadenopathy. Reproductive: Prostate is unremarkable. Other: No simple free fluid ascites. No pneumoperitoneum. No hemoperitoneum. No mesenteric hematoma identified. No organized fluid collection. Musculoskeletal: No significant soft tissue hematoma. Tiny fat containing umbilical hernia. Acute displaced right inferior and superior pubic rami fractures extending to the right anterior acetabular wall and pubic symphysis. Acute nondisplaced left inferior and superior pubic rami. Acute displaced right sacral ala fracture. No associated pelvic diastasis. Asymmetric slightly enlarged and heterogeneous right obturator internus and externus suggestive underlying muscular hematoma. Grade 1 anterolisthesis of L5 on S1. Other ports and devices: None. IMPRESSION: 1. Acute displaced right inferior and superior pubic rami fractures extending to the right anterior acetabular wall and pubic symphysis. Asymmetric slightly enlarged and heterogeneous right obturator internus and externus suggestive underlying muscular hematoma. 2. Acute nondisplaced left inferior and superior pubic rami. 3. Acute displaced right sacral ala fracture. 4. No acute intrathoracic, intra-abdominal, intrapelvic traumatic injury. 5. No acute fracture or traumatic malalignment of the thoracic or lumbar spine. 6. Right chest wall 6.6 x 3.8 cm fluid density lesion that abuts the pectoralis muscle. Correlate with physical exam. Consider diagnostic mammography if clinically indicated. 7. Aortic Atherosclerosis (ICD10-I70.0) and Emphysema (ICD10-J43.9). Electronically Signed   By: Morgane  Naveau M.D.   On: 06/18/2024 21:05   CT HEAD WO CONTRAST Result Date: 06/18/2024 CLINICAL DATA:  Head trauma, moderate-severe; Polytrauma, blunt; Facial trauma, blunt EXAM: CT HEAD WITHOUT CONTRAST CT MAXILLOFACIAL WITHOUT CONTRAST CT CERVICAL SPINE WITHOUT CONTRAST TECHNIQUE:  Multidetector CT imaging of the head, cervical spine, and maxillofacial structures were performed using the standard protocol without intravenous contrast. Multiplanar CT image reconstructions of the cervical spine and maxillofacial structures were also generated. RADIATION DOSE REDUCTION: This exam was performed according to the departmental dose-optimization program which includes automated exposure control, adjustment of the mA and/or kV according to patient size and/or use of iterative reconstruction technique. COMPARISON:  None Available. FINDINGS:  CT HEAD FINDINGS Brain: Patchy and confluent areas of decreased attenuation are noted throughout the deep and periventricular white matter of the cerebral hemispheres bilaterally, compatible with chronic microvascular ischemic disease. No evidence of large-territorial acute infarction. No parenchymal hemorrhage. No mass lesion. No extra-axial collection. No mass effect or midline shift. No hydrocephalus. Basilar cisterns are patent. Vascular: No hyperdense vessel. Skull: No acute fracture or focal lesion. Other: None. CT MAXILLOFACIAL FINDINGS Osseous: No fracture or mandibular dislocation. No destructive process. Sinuses/Orbits: Bilateral maxillary sinus mucosal thickening. Otherwise paranasal sinuses and mastoid air cells are clear. The orbits are unremarkable. Soft tissues: Negative. CT CERVICAL SPINE FINDINGS Alignment: Normal. Skull base and vertebrae: No acute fracture. No aggressive appearing focal osseous lesion or focal pathologic process. Soft tissues and spinal canal: No prevertebral fluid or swelling. No visible canal hematoma. Upper chest: Paraseptal emphysematous changes. Other: None. IMPRESSION: 1. No acute intracranial abnormality. 2.  No acute displaced facial fracture. 3. No acute displaced fracture or traumatic listhesis of the cervical spine. 4.  Emphysema (ICD10-J43.9). Electronically Signed   By: Morgane  Naveau M.D.   On: 06/18/2024 20:47    CT MAXILLOFACIAL WO CONTRAST Result Date: 06/18/2024 CLINICAL DATA:  Head trauma, moderate-severe; Polytrauma, blunt; Facial trauma, blunt EXAM: CT HEAD WITHOUT CONTRAST CT MAXILLOFACIAL WITHOUT CONTRAST CT CERVICAL SPINE WITHOUT CONTRAST TECHNIQUE: Multidetector CT imaging of the head, cervical spine, and maxillofacial structures were performed using the standard protocol without intravenous contrast. Multiplanar CT image reconstructions of the cervical spine and maxillofacial structures were also generated. RADIATION DOSE REDUCTION: This exam was performed according to the departmental dose-optimization program which includes automated exposure control, adjustment of the mA and/or kV according to patient size and/or use of iterative reconstruction technique. COMPARISON:  None Available. FINDINGS: CT HEAD FINDINGS Brain: Patchy and confluent areas of decreased attenuation are noted throughout the deep and periventricular white matter of the cerebral hemispheres bilaterally, compatible with chronic microvascular ischemic disease. No evidence of large-territorial acute infarction. No parenchymal hemorrhage. No mass lesion. No extra-axial collection. No mass effect or midline shift. No hydrocephalus. Basilar cisterns are patent. Vascular: No hyperdense vessel. Skull: No acute fracture or focal lesion. Other: None. CT MAXILLOFACIAL FINDINGS Osseous: No fracture or mandibular dislocation. No destructive process. Sinuses/Orbits: Bilateral maxillary sinus mucosal thickening. Otherwise paranasal sinuses and mastoid air cells are clear. The orbits are unremarkable. Soft tissues: Negative. CT CERVICAL SPINE FINDINGS Alignment: Normal. Skull base and vertebrae: No acute fracture. No aggressive appearing focal osseous lesion or focal pathologic process. Soft tissues and spinal canal: No prevertebral fluid or swelling. No visible canal hematoma. Upper chest: Paraseptal emphysematous changes. Other: None. IMPRESSION: 1. No  acute intracranial abnormality. 2.  No acute displaced facial fracture. 3. No acute displaced fracture or traumatic listhesis of the cervical spine. 4.  Emphysema (ICD10-J43.9). Electronically Signed   By: Morgane  Naveau M.D.   On: 06/18/2024 20:47   CT CERVICAL SPINE WO CONTRAST Result Date: 06/18/2024 CLINICAL DATA:  Head trauma, moderate-severe; Polytrauma, blunt; Facial trauma, blunt EXAM: CT HEAD WITHOUT CONTRAST CT MAXILLOFACIAL WITHOUT CONTRAST CT CERVICAL SPINE WITHOUT CONTRAST TECHNIQUE: Multidetector CT imaging of the head, cervical spine, and maxillofacial structures were performed using the standard protocol without intravenous contrast. Multiplanar CT image reconstructions of the cervical spine and maxillofacial structures were also generated. RADIATION DOSE REDUCTION: This exam was performed according to the departmental dose-optimization program which includes automated exposure control, adjustment of the mA and/or kV according to patient size and/or use of iterative reconstruction technique. COMPARISON:  None Available. FINDINGS: CT HEAD FINDINGS Brain: Patchy and confluent areas of decreased attenuation are noted throughout the deep and periventricular white matter of the cerebral hemispheres bilaterally, compatible with chronic microvascular ischemic disease. No evidence of large-territorial acute infarction. No parenchymal hemorrhage. No mass lesion. No extra-axial collection. No mass effect or midline shift. No hydrocephalus. Basilar cisterns are patent. Vascular: No hyperdense vessel. Skull: No acute fracture or focal lesion. Other: None. CT MAXILLOFACIAL FINDINGS Osseous: No fracture or mandibular dislocation. No destructive process. Sinuses/Orbits: Bilateral maxillary sinus mucosal thickening. Otherwise paranasal sinuses and mastoid air cells are clear. The orbits are unremarkable. Soft tissues: Negative. CT CERVICAL SPINE FINDINGS Alignment: Normal. Skull base and vertebrae: No acute  fracture. No aggressive appearing focal osseous lesion or focal pathologic process. Soft tissues and spinal canal: No prevertebral fluid or swelling. No visible canal hematoma. Upper chest: Paraseptal emphysematous changes. Other: None. IMPRESSION: 1. No acute intracranial abnormality. 2.  No acute displaced facial fracture. 3. No acute displaced fracture or traumatic listhesis of the cervical spine. 4.  Emphysema (ICD10-J43.9). Electronically Signed   By: Morgane  Naveau M.D.   On: 06/18/2024 20:47   DG Hip [place] View W or [place] Pelvis 1 View Right Result Date: 06/18/2024 EXAM: 3 VIEW XRAY OF THE RIGHT HIP 06/18/2024 08:14:36 PM COMPARISON: None available. CLINICAL HISTORY: Fall. FINDINGS: BONES AND JOINTS: No acute fracture or focal osseous lesion. The hip joint is maintained. No significant degenerative changes. SOFT TISSUES: The soft tissues are unremarkable. IMPRESSION: 1. Negative. Electronically signed by: Pinkie Pebbles MD 06/18/2024 08:25 PM EDT RP Workstation: [id number]   DG Pelvis Portable Result Date: 06/18/2024 EXAM: 2 VIEW(S) XRAY OF THE PELVIS 06/18/2024 08:14:36 PM COMPARISON: None available. CLINICAL HISTORY: Trauma. FINDINGS: BONES AND JOINTS: No acute fracture. No focal osseous lesion. No joint dislocation. SOFT TISSUES: The soft tissues are unremarkable. IMPRESSION: 1. Negative. Electronically signed by: Pinkie Pebbles MD 06/18/2024 08:24 PM EDT RP Workstation: [id number]   DG Chest Port 1 View Result Date: 06/18/2024 EXAM: 1 VIEW XRAY OF THE CHEST 06/18/2024 08:14:36 PM COMPARISON: CT chest dated 08/30/2021. CLINICAL HISTORY: Trauma. FINDINGS: LUNGS AND PLEURA: No focal pulmonary opacity. No pulmonary edema. No pleural effusion. No pneumothorax. HEART AND MEDIASTINUM: No acute abnormality of the cardiac and mediastinal silhouettes. BONES AND SOFT TISSUES: No acute osseous abnormality. Status post orif of the right clavicl. Old right lateral fifth rib fracture deformity. IMPRESSION:  1. No acute process. Electronically signed by: Pinkie Pebbles MD 06/18/2024 08:24 PM EDT RP Workstation: [id number]   DG Femur Min 2 Views Left Result Date: 06/18/2024 EXAM: 2 VIEW(S) XRAY OF THE LEFT FEMUR 06/18/2024 08:14:36 PM COMPARISON: None available. CLINICAL HISTORY: Fall. FINDINGS: BONES AND JOINTS: No acute fracture. No focal osseous lesion. No joint dislocation. SOFT TISSUES: The soft tissues are unremarkable. IMPRESSION: 1. Negative. Electronically signed by: Pinkie Pebbles MD 06/18/2024 08:23 PM EDT RP Workstation: [id number]    ROS 10 point review of systems is negative except as listed above in HPI.   Physical Exam Blood pressure (!) 150/96, pulse 68, temperature 97.9 F (36.6 C), resp. rate 12, height 5' 6 (1.676 m), weight 72.6 kg, SpO2 99%. Constitutional: well-developed, well-nourished HEENT: pupils equal, round, reactive to light, 2mm b/l, moist conjunctiva, external inspection of ears and nose normal, hearing intact Oropharynx: normal oropharyngeal mucosa, poor dentition Neck: no thyromegaly, trachea midline, no midline cervical tenderness to palpation Chest: breath sounds equal bilaterally, normal respiratory effort, no midline or lateral chest wall tenderness to palpation/deformity Abdomen: soft, NT,  no bruising, no hepatosplenomegaly Extremities: 2+ radial and pedal pulses bilaterally, intact motor and sensation bilateral UE and LE, no peripheral edema, pain to B thighs MSK: unable to assess gait/station, no clubbing/cyanosis of fingers/toes, normal ROM of all four extremities Skin: warm, dry, no rashes Psych: normal memory, normal mood/affect     Assessment/Plan: FFH  Pelvic fx - nonop, pain control, PT/OT  R thigh pain - XR pending FEN - regular diet DVT - SCDs, LMWH Dispo - 4NP    Dreama GEANNIE Hanger, MD General and Trauma Surgery [facility] Surgery

## 2024-06-19 NOTE — Evaluation (Signed)
 Physical Therapy Evaluation Patient Details Name: Timothy Estrada MRN: [id number] DOB: 03/02/1971 Today's Date: 06/19/2024  History of Present Illness  53 y.o. male arriving to [facility] ED via [facility] 06/18/24 after 20 ft fall from ladder onto rt side. +ETOH; CT head, c-spine, chest, abdomen/pelvis +bil pubic rami and R acetabular fxs;  Clinical Impression  Pt admitted secondary to problem above with deficits below. PTA patient lives in 3rd floor apartment with his sister. He was independent with all mobility and working as a Education administrator. He states he is going to try to arrange to stay with someone who has fewer steps to enter, however does not yet have a definite plan. Pt currently requires CGA for bed mobility and min assist for transfers and ambulation x 20 ft with RW. Anticipate patient will benefit from PT to address problems listed below. Will continue to follow acutely to maximize functional mobility, independence, and safety.  If pt finds a place to stay with fewer steps to enter, feel he will progress to discharge with either HHPT or no follow-up therapy.          If plan is discharge home, recommend the following: Assistance with cooking/housework;Assist for transportation;Help with stairs or ramp for entrance   Can travel by private vehicle        Equipment Recommendations Rolling walker (2 wheels)  Recommendations for Other Services       Functional Status Assessment Patient has had a recent decline in their functional status and demonstrates the ability to make significant improvements in function in a reasonable and predictable amount of time.     Precautions / Restrictions Precautions Precautions: Fall Recall of Precautions/Restrictions: Intact Precaution/Restrictions Comments: no hip precautions per Dr. Elsa Restrictions Weight Bearing Restrictions Per Provider Order: Yes RLE Weight Bearing Per Provider Order: Touchdown weight bearing      Mobility  Bed  Mobility Overal bed mobility: Needs Assistance Bed Mobility: Supine to Sit     Supine to sit: Contact guard, HOB elevated     General bed mobility comments: HOB ~30; pt moved slowly but no physical assist    Transfers Overall transfer level: Needs assistance Equipment used: Rolling walker (2 wheels) Transfers: Sit to/from Stand Sit to Stand: Min assist, +2 safety/equipment           General transfer comment: pt pushing up with one hand on RW (stabilized by PT) with min boosting assist    Ambulation/Gait Ambulation/Gait assistance: Contact guard assist Gait Distance (Feet): 20 Feet Assistive device: Rolling walker (2 wheels) Gait Pattern/deviations: Step-to pattern   Gait velocity interpretation: <1.8 ft/sec, indicate of risk for recurrent falls   General Gait Details: pt actually maintained NWB>TDWB throughout; good use of UEs to clear LLE during ambulation  Stairs            Wheelchair Mobility     Tilt Bed    Modified Rankin (Stroke Patients Only)       Balance Overall balance assessment: Mild deficits observed, not formally tested                                           Pertinent Vitals/Pain Pain Assessment Pain Assessment: 0-10 Pain Score: 10-Worst pain ever Pain Location: RLE Pain Descriptors / Indicators: Cramping, Discomfort, Grimacing, Guarding Pain Intervention(s): Limited activity within patient's tolerance, Monitored during session, Premedicated before session, Repositioned  Home Living Family/patient expects to be discharged to:: Private residence Living Arrangements: Other relatives (SISTER)   Type of Home: Apartment Home Access: Stairs to enter Entrance Stairs-Rails: Doctor, general practice of Steps: 3 flights   Home Layout: One level Home Equipment: None Additional Comments: pt hoping to find someone to stay with that has fewer steps to enter    Prior Function Prior Level of Function :  Independent/Modified Independent;Working/employed;Driving Surveyor, [employer])                     Extremity/Trunk Assessment   Upper Extremity Assessment Upper Extremity Assessment: Defer to OT evaluation    Lower Extremity Assessment Lower Extremity Assessment: RLE deficits/detail RLE Deficits / Details: AROM WFL but moves very slowly, guarded    Cervical / Trunk Assessment Cervical / Trunk Assessment: Normal  Communication   Communication Communication: Impaired Factors Affecting Communication: Hearing impaired    Cognition Arousal: Alert Behavior During Therapy: WFL for tasks assessed/performed   PT - Cognitive impairments: No apparent impairments                         Following commands: Intact       Cueing Cueing Techniques: Verbal cues     General Comments      Exercises     Assessment/Plan    PT Assessment Patient needs continued PT services  PT Problem List Decreased activity tolerance;Decreased balance;Decreased mobility;Decreased knowledge of use of DME;Pain       PT Treatment Interventions DME instruction;Gait training;Stair training;Functional mobility training;Therapeutic activities;Therapeutic exercise;Balance training;Patient/family education    PT Goals (Current goals can be found in the Care Plan section)  Acute Rehab PT Goals Patient Stated Goal: find somewhere to stay with fewer steps PT Goal Formulation: With patient Time For Goal Achievement: 07/03/24 Potential to Achieve Goals: Good    Frequency Min 3X/week     Co-evaluation PT/OT/SLP Co-Evaluation/Treatment: Yes Reason for Co-Treatment: Complexity of the patient's impairments (multi-system involvement);To address functional/ADL transfers;Other (comment) (pain level) PT goals addressed during session: Mobility/safety with mobility;Balance;Proper use of DME         AM-PAC PT 6 Clicks Mobility  Outcome Measure Help needed turning from your back to your side while in  a flat bed without using bedrails?: None Help needed moving from lying on your back to sitting on the side of a flat bed without using bedrails?: A Little Help needed moving to and from a bed to a chair (including a wheelchair)?: A Little Help needed standing up from a chair using your arms (e.g., wheelchair or bedside chair)?: A Little Help needed to walk in hospital room?: A Little Help needed climbing 3-5 steps with a railing? : A Lot 6 Click Score: 18    End of Session Equipment Utilized During Treatment: Gait belt Activity Tolerance: Patient limited by pain Patient left: in chair;with call bell/phone within reach;with chair alarm set Nurse Communication: Mobility status;Weight bearing status PT Visit Diagnosis: Other abnormalities of gait and mobility (R26.89)    Time: 8883-8862 PT Time Calculation (min) (ACUTE ONLY): 21 min   Charges:   PT Evaluation $PT Eval Low Complexity: 1 Low   PT General Charges $$ ACUTE PT VISIT: 1 Visit          Macario RAMAN, PT Acute Rehabilitation Services  Office [phone]   Macario SHAUNNA Soja 06/19/2024, 12:42 PM

## 2024-06-19 NOTE — Progress Notes (Signed)
 PT Cancellation Note  Patient Details Name: Timothy Estrada MRN: [id number] DOB: [birth year]   Cancelled Treatment:    Reason Eval/Treat Not Completed: Patient not medically ready  Noted pending R thigh xray and full ortho consult. Will monitor for results and see if appropriate.    Timothy Estrada, PT Acute Rehabilitation Services  Office [phone]   Timothy Estrada 06/19/2024, 8:35 AM

## 2024-06-20 MED ORDER — HYDROMORPHONE HCL 1 MG/ML IJ SOLN
0.5000 mg | INTRAMUSCULAR | Status: DC | PRN
  Administered 2024-06-20 (×2): 1 mg via INTRAVENOUS
  Filled 2024-06-20 (×3): qty 1

## 2024-06-20 MED ORDER — NICOTINE 21 MG/24HR TD PT24
21.0000 mg | MEDICATED_PATCH | Freq: Every day | TRANSDERMAL | Status: DC
  Administered 2024-06-20 – 2024-06-21 (×2): 21 mg via TRANSDERMAL
  Filled 2024-06-20 (×2): qty 1

## 2024-06-20 MED ORDER — GABAPENTIN 300 MG PO CAPS
300.0000 mg | ORAL_CAPSULE | Freq: Three times a day (TID) | ORAL | Status: DC
  Administered 2024-06-20 – 2024-06-21 (×5): 300 mg via ORAL
  Filled 2024-06-20 (×5): qty 1

## 2024-06-20 MED ORDER — THIAMINE MONONITRATE 100 MG PO TABS
100.0000 mg | ORAL_TABLET | Freq: Every day | ORAL | Status: DC
  Administered 2024-06-20 – 2024-06-21 (×2): 100 mg via ORAL
  Filled 2024-06-20 (×2): qty 1

## 2024-06-20 MED ORDER — ADULT MULTIVITAMIN W/MINERALS CH
1.0000 | ORAL_TABLET | Freq: Every day | ORAL | Status: DC
  Administered 2024-06-20 – 2024-06-21 (×2): 1 via ORAL
  Filled 2024-06-20 (×2): qty 1

## 2024-06-20 MED ORDER — THIAMINE HCL 100 MG/ML IJ SOLN: 100.0000 mg | Freq: Every day | INTRAMUSCULAR | Status: DC

## 2024-06-20 MED ORDER — DIAZEPAM 5 MG/ML IJ SOLN: 2.5000 mg | INTRAMUSCULAR | Status: DC | PRN

## 2024-06-20 MED ORDER — FOLIC ACID 1 MG PO TABS
1.0000 mg | ORAL_TABLET | Freq: Every day | ORAL | Status: DC
  Administered 2024-06-20 – 2024-06-21 (×2): 1 mg via ORAL
  Filled 2024-06-20 (×2): qty 1

## 2024-06-20 MED ORDER — LORAZEPAM 1 MG PO TABS: 1.0000 mg | ORAL_TABLET | ORAL | Status: DC | PRN

## 2024-06-20 MED ORDER — LORAZEPAM 2 MG/ML IJ SOLN: 1.0000 mg | INTRAMUSCULAR | Status: DC | PRN
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
## 2024-06-20 NOTE — Progress Notes (Signed)
 PT Cancellation Note  Patient Details Name: Timothy Estrada MRN: [id number] DOB: 1971-07-07   Cancelled Treatment:    Reason Eval/Treat Not Completed: Patient not medically ready  Noted incr pain overnight and plans for orthopedic team to reassess for possible need for surgery. Will continue to follow and see as appropriate.   Macario RAMAN, PT Acute Rehabilitation Services  Office [phone]   Macario SHAUNNA Soja 06/20/2024, 9:31 AM

## 2024-06-20 NOTE — Progress Notes (Addendum)
 Subjective/Chief Complaint: Issues with pain control overnight.  Cannot really get comfortable.  Pain radiating down both thighs.   Objective: Vital signs in last 24 hours: Temp:  [97.6 F (36.4 C)-98 F (36.7 C)] 97.6 F (36.4 C) (07/20 0000) Pulse Rate:  [53-73] 73 (07/19 2000) Resp:  [14-19] 14 (07/20 0000) BP: (119-178)/(87-140) 162/105 (07/20 0000) SpO2:  [74 %-96 %] 74 % (07/19 2000) Last BM Date :  (PTA)  Intake/Output from previous day: 07/19 0701 - 07/20 0700 In: -  Out: 825 [Urine:825] Intake/Output this shift: Total I/O In: -  Out: 600 [Urine:600]  Alert, oriented, no distress Alert respirations Abdomen soft and nontender Follows commands x 4 extremities, no gross neurologic deficits Palpable distal pulses, no significant extremity edema  Lab Results:  Recent Labs    06/18/24 1959 06/19/24 0429  WBC 11.5* 9.6  HGB 14.1 12.2*  HCT 41.9 36.1*  PLT 256 190   BMET Recent Labs    06/18/24 1959 06/19/24 0429  NA 143 142  K 3.5 3.8  CL 107 106  CO2 23 26  GLUCOSE 95 86  BUN 9 8  CREATININE 0.86 0.98  CALCIUM 8.6* 8.5*   PT/INR No results for input(s): LABPROT, INR in the last 72 hours. ABG No results for input(s): PHART, HCO3 in the last 72 hours.  Invalid input(s): PCO2, PO2  Studies/Results: DG FEMUR PORT, MIN 2 VIEWS RIGHT Result Date: 06/19/2024 CLINICAL DATA:  Pain. EXAM: RIGHT FEMUR PORTABLE 2 VIEW COMPARISON:  Pelvis and right hip radiograph yesterday. FINDINGS: No femur fracture. The cortical margins of the femur are intact. Hip and knee alignment are maintained. No focal bone abnormality, erosion, or bony destructive change. Unremarkable soft tissues. IMPRESSION: Negative radiographs of the right femur. Electronically Signed   By: Andrea Gasman M.D.   On: 06/19/2024 11:01   CT CHEST ABDOMEN PELVIS W CONTRAST Result Date: 06/18/2024 CLINICAL DATA:  Polytrauma, blunt.  Fall level 2. EXAM: CT CHEST, ABDOMEN, AND PELVIS  WITH CONTRAST TECHNIQUE: Multidetector CT imaging of the chest, abdomen and pelvis was performed following the standard protocol during bolus administration of intravenous contrast. RADIATION DOSE REDUCTION: This exam was performed according to the departmental dose-optimization program which includes automated exposure control, adjustment of the mA and/or kV according to patient size and/or use of iterative reconstruction technique. CONTRAST:  75mL OMNIPAQUE  IOHEXOL  350 MG/ML SOLN COMPARISON:  CT chest 08/30/2021 FINDINGS: CHEST: Cardiovascular: No aortic injury. The thoracic aorta is normal in caliber. The heart is normal in size. No significant pericardial effusion. Mediastinum/Nodes: No pneumomediastinum. No mediastinal hematoma. The esophagus is unremarkable. The thyroid  is unremarkable. The central airways are patent. No mediastinal, hilar, or axillary lymphadenopathy. Lungs/Pleura: Paraseptal emphysematous changes. No focal consolidation. No pulmonary nodule. No pulmonary mass. No pulmonary contusion or laceration. No pneumatocele formation. No pleural effusion. No pneumothorax. No hemothorax. Musculoskeletal/Chest wall: Right chest wall 6.6 x 3.8 cm fluid density lesion that abuts the pectoralis muscle. No acute rib or sternal fracture. No spinal fracture. Old healed right posterior first rib fracture. Plate and screw fixation of an old healed right clavicular fracture. ABDOMEN / PELVIS: Hepatobiliary: Not enlarged. No focal lesion. No laceration or subcapsular hematoma. The gallbladder is otherwise unremarkable with no radio-opaque gallstones. No biliary ductal dilatation. Pancreas: Normal pancreatic contour. No main pancreatic duct dilatation. Spleen: Not enlarged. No focal lesion. No laceration, subcapsular hematoma, or vascular injury. Adrenals/Urinary Tract: No nodularity bilaterally. Bilateral kidneys enhance symmetrically. No hydronephrosis. No contusion, laceration, or subcapsular hematoma. Bilateral  pericentimeter fluid density lesions likely represent simple renal cysts. Simple renal cysts, in the absence of clinically indicated signs/symptoms, require no independent follow-up. No injury to the vascular structures or collecting systems. No hydroureter. The urinary bladder is unremarkable. On delayed imaging, there is no urothelial wall thickening and there are no filling defects in the opacified portions of the bilateral collecting systems or ureters. Stomach/Bowel: No small or large bowel wall thickening or dilatation. The appendix is unremarkable. Vasculature/Lymphatics: Moderate atherosclerotic plaque. No abdominal aorta or iliac aneurysm. No active contrast extravasation or pseudoaneurysm. No abdominal, pelvic, inguinal lymphadenopathy. Reproductive: Prostate is unremarkable. Other: No simple free fluid ascites. No pneumoperitoneum. No hemoperitoneum. No mesenteric hematoma identified. No organized fluid collection. Musculoskeletal: No significant soft tissue hematoma. Tiny fat containing umbilical hernia. Acute displaced right inferior and superior pubic rami fractures extending to the right anterior acetabular wall and pubic symphysis. Acute nondisplaced left inferior and superior pubic rami. Acute displaced right sacral ala fracture. No associated pelvic diastasis. Asymmetric slightly enlarged and heterogeneous right obturator internus and externus suggestive underlying muscular hematoma. Grade 1 anterolisthesis of L5 on S1. Other ports and devices: None. IMPRESSION: 1. Acute displaced right inferior and superior pubic rami fractures extending to the right anterior acetabular wall and pubic symphysis. Asymmetric slightly enlarged and heterogeneous right obturator internus and externus suggestive underlying muscular hematoma. 2. Acute nondisplaced left inferior and superior pubic rami. 3. Acute displaced right sacral ala fracture. 4. No acute intrathoracic, intra-abdominal, intrapelvic traumatic injury.  5. No acute fracture or traumatic malalignment of the thoracic or lumbar spine. 6. Right chest wall 6.6 x 3.8 cm fluid density lesion that abuts the pectoralis muscle. Correlate with physical exam. Consider diagnostic mammography if clinically indicated. 7. Aortic Atherosclerosis (ICD10-I70.0) and Emphysema (ICD10-J43.9). Electronically Signed   By: Morgane  Naveau M.D.   On: 06/18/2024 21:05   CT HEAD WO CONTRAST Result Date: 06/18/2024 CLINICAL DATA:  Head trauma, moderate-severe; Polytrauma, blunt; Facial trauma, blunt EXAM: CT HEAD WITHOUT CONTRAST CT MAXILLOFACIAL WITHOUT CONTRAST CT CERVICAL SPINE WITHOUT CONTRAST TECHNIQUE: Multidetector CT imaging of the head, cervical spine, and maxillofacial structures were performed using the standard protocol without intravenous contrast. Multiplanar CT image reconstructions of the cervical spine and maxillofacial structures were also generated. RADIATION DOSE REDUCTION: This exam was performed according to the departmental dose-optimization program which includes automated exposure control, adjustment of the mA and/or kV according to patient size and/or use of iterative reconstruction technique. COMPARISON:  None Available. FINDINGS: CT HEAD FINDINGS Brain: Patchy and confluent areas of decreased attenuation are noted throughout the deep and periventricular white matter of the cerebral hemispheres bilaterally, compatible with chronic microvascular ischemic disease. No evidence of large-territorial acute infarction. No parenchymal hemorrhage. No mass lesion. No extra-axial collection. No mass effect or midline shift. No hydrocephalus. Basilar cisterns are patent. Vascular: No hyperdense vessel. Skull: No acute fracture or focal lesion. Other: None. CT MAXILLOFACIAL FINDINGS Osseous: No fracture or mandibular dislocation. No destructive process. Sinuses/Orbits: Bilateral maxillary sinus mucosal thickening. Otherwise paranasal sinuses and mastoid air cells are clear.  The orbits are unremarkable. Soft tissues: Negative. CT CERVICAL SPINE FINDINGS Alignment: Normal. Skull base and vertebrae: No acute fracture. No aggressive appearing focal osseous lesion or focal pathologic process. Soft tissues and spinal canal: No prevertebral fluid or swelling. No visible canal hematoma. Upper chest: Paraseptal emphysematous changes. Other: None. IMPRESSION: 1. No acute intracranial abnormality. 2.  No acute displaced facial fracture. 3. No acute displaced fracture or traumatic listhesis of the cervical spine. 4.  Emphysema (ICD10-J43.9). Electronically Signed   By: Morgane  Naveau M.D.   On: 06/18/2024 20:47   CT MAXILLOFACIAL WO CONTRAST Result Date: 06/18/2024 CLINICAL DATA:  Head trauma, moderate-severe; Polytrauma, blunt; Facial trauma, blunt EXAM: CT HEAD WITHOUT CONTRAST CT MAXILLOFACIAL WITHOUT CONTRAST CT CERVICAL SPINE WITHOUT CONTRAST TECHNIQUE: Multidetector CT imaging of the head, cervical spine, and maxillofacial structures were performed using the standard protocol without intravenous contrast. Multiplanar CT image reconstructions of the cervical spine and maxillofacial structures were also generated. RADIATION DOSE REDUCTION: This exam was performed according to the departmental dose-optimization program which includes automated exposure control, adjustment of the mA and/or kV according to patient size and/or use of iterative reconstruction technique. COMPARISON:  None Available. FINDINGS: CT HEAD FINDINGS Brain: Patchy and confluent areas of decreased attenuation are noted throughout the deep and periventricular white matter of the cerebral hemispheres bilaterally, compatible with chronic microvascular ischemic disease. No evidence of large-territorial acute infarction. No parenchymal hemorrhage. No mass lesion. No extra-axial collection. No mass effect or midline shift. No hydrocephalus. Basilar cisterns are patent. Vascular: No hyperdense vessel. Skull: No acute fracture  or focal lesion. Other: None. CT MAXILLOFACIAL FINDINGS Osseous: No fracture or mandibular dislocation. No destructive process. Sinuses/Orbits: Bilateral maxillary sinus mucosal thickening. Otherwise paranasal sinuses and mastoid air cells are clear. The orbits are unremarkable. Soft tissues: Negative. CT CERVICAL SPINE FINDINGS Alignment: Normal. Skull base and vertebrae: No acute fracture. No aggressive appearing focal osseous lesion or focal pathologic process. Soft tissues and spinal canal: No prevertebral fluid or swelling. No visible canal hematoma. Upper chest: Paraseptal emphysematous changes. Other: None. IMPRESSION: 1. No acute intracranial abnormality. 2.  No acute displaced facial fracture. 3. No acute displaced fracture or traumatic listhesis of the cervical spine. 4.  Emphysema (ICD10-J43.9). Electronically Signed   By: Morgane  Naveau M.D.   On: 06/18/2024 20:47   CT CERVICAL SPINE WO CONTRAST Result Date: 06/18/2024 CLINICAL DATA:  Head trauma, moderate-severe; Polytrauma, blunt; Facial trauma, blunt EXAM: CT HEAD WITHOUT CONTRAST CT MAXILLOFACIAL WITHOUT CONTRAST CT CERVICAL SPINE WITHOUT CONTRAST TECHNIQUE: Multidetector CT imaging of the head, cervical spine, and maxillofacial structures were performed using the standard protocol without intravenous contrast. Multiplanar CT image reconstructions of the cervical spine and maxillofacial structures were also generated. RADIATION DOSE REDUCTION: This exam was performed according to the departmental dose-optimization program which includes automated exposure control, adjustment of the mA and/or kV according to patient size and/or use of iterative reconstruction technique. COMPARISON:  None Available. FINDINGS: CT HEAD FINDINGS Brain: Patchy and confluent areas of decreased attenuation are noted throughout the deep and periventricular white matter of the cerebral hemispheres bilaterally, compatible with chronic microvascular ischemic disease. No  evidence of large-territorial acute infarction. No parenchymal hemorrhage. No mass lesion. No extra-axial collection. No mass effect or midline shift. No hydrocephalus. Basilar cisterns are patent. Vascular: No hyperdense vessel. Skull: No acute fracture or focal lesion. Other: None. CT MAXILLOFACIAL FINDINGS Osseous: No fracture or mandibular dislocation. No destructive process. Sinuses/Orbits: Bilateral maxillary sinus mucosal thickening. Otherwise paranasal sinuses and mastoid air cells are clear. The orbits are unremarkable. Soft tissues: Negative. CT CERVICAL SPINE FINDINGS Alignment: Normal. Skull base and vertebrae: No acute fracture. No aggressive appearing focal osseous lesion or focal pathologic process. Soft tissues and spinal canal: No prevertebral fluid or swelling. No visible canal hematoma. Upper chest: Paraseptal emphysematous changes. Other: None. IMPRESSION: 1. No acute intracranial abnormality. 2.  No acute displaced facial fracture. 3. No acute displaced fracture or traumatic listhesis of the cervical  spine. 4.  Emphysema (ICD10-J43.9). Electronically Signed   By: Morgane  Naveau M.D.   On: 06/18/2024 20:47   DG Hip [place] or [place] Pelvis 1 View Right Result Date: 06/18/2024 EXAM: 3 VIEW XRAY OF THE RIGHT HIP 06/18/2024 08:14:36 PM COMPARISON: None available. CLINICAL HISTORY: Fall. FINDINGS: BONES AND JOINTS: No acute fracture or focal osseous lesion. The hip joint is maintained. No significant degenerative changes. SOFT TISSUES: The soft tissues are unremarkable. IMPRESSION: 1. Negative. Electronically signed by: Pinkie Pebbles MD 06/18/2024 08:25 PM EDT RP Workstation: [id number]   DG Pelvis Portable Result Date: 06/18/2024 EXAM: 2 VIEW(S) XRAY OF THE PELVIS 06/18/2024 08:14:36 PM COMPARISON: None available. CLINICAL HISTORY: Trauma. FINDINGS: BONES AND JOINTS: No acute fracture. No focal osseous lesion. No joint dislocation. SOFT TISSUES: The soft tissues are unremarkable.  IMPRESSION: 1. Negative. Electronically signed by: Pinkie Pebbles MD 06/18/2024 08:24 PM EDT RP Workstation: [id number]   DG Chest Port 1 View Result Date: 06/18/2024 EXAM: 1 VIEW XRAY OF THE CHEST 06/18/2024 08:14:36 PM COMPARISON: CT chest dated 08/30/2021. CLINICAL HISTORY: Trauma. FINDINGS: LUNGS AND PLEURA: No focal pulmonary opacity. No pulmonary edema. No pleural effusion. No pneumothorax. HEART AND MEDIASTINUM: No acute abnormality of the cardiac and mediastinal silhouettes. BONES AND SOFT TISSUES: No acute osseous abnormality. Status post orif of the right clavicl. Old right lateral fifth rib fracture deformity. IMPRESSION: 1. No acute process. Electronically signed by: Pinkie Pebbles MD 06/18/2024 08:24 PM EDT RP Workstation: [id number]   DG Femur Min 2 Views Left Result Date: 06/18/2024 EXAM: 2 VIEW(S) XRAY OF THE LEFT FEMUR 06/18/2024 08:14:36 PM COMPARISON: None available. CLINICAL HISTORY: Fall. FINDINGS: BONES AND JOINTS: No acute fracture. No focal osseous lesion. No joint dislocation. SOFT TISSUES: The soft tissues are unremarkable. IMPRESSION: 1. Negative. Electronically signed by: Pinkie Pebbles MD 06/18/2024 08:23 PM EDT RP Workstation: [id number]    Anti-infectives: Anti-infectives (From admission, onward)    None       Assessment/Plan: 54 year old male status post fall from ladder Pelvic fx - 1Acute displaced right inferior and superior pubic rami fractures extending to the right anterior acetabular wall and pubic symphysis. Asymmetric slightly enlarged and heterogeneous right obturator internus and externus suggestive underlying muscular hematoma. 2. Acute nondisplaced left inferior and superior pubic rami. 3. Acute displaced right sacral ala fracture. Current plan nonop, pain control, PT/OT.  Pain not adequately controlled, will make adjustments today, reassess.  Touchdown weightbearing on right side per Dr. Isiah note. Orthopedic team will reassess today for  possible need for surgical intervention.  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 Correlate with physical exam. Consider diagnostic mammography if clinically indicated. - Aortic Atherosclerosis  - Emphysema    LOS: 0 days    Mitzie DELENA Freund 06/20/2024

## 2024-06-20 NOTE — Progress Notes (Signed)
 Physical Therapy Treatment Patient Details Name: Timothy Estrada MRN: [id number] DOB: Jun 05, 1971 Today's Date: 06/20/2024   History of Present Illness 53 y.o. male arriving to [facility] ED via [facility] 06/18/24 after 20 ft fall from ladder onto rt side. +ETOH; CT head, c-spine, chest, abdomen/pelvis +bil pubic rami and R acetabular fxs;    PT Comments  Pt eager to get OOB on arrival. Noted has been having incr anterior L thigh pain which persisted throughout this session. Patient moves slowly due to pain, and needs very little physical assist (primarily min assist for sit to stand with RW). As pt being situated in the chair he reported the ortho guy today said he was going to have a specialist come see me tomorrow. Will plan to see 7/21 after additional ortho input.     If plan is discharge home, recommend the following: Assistance with cooking/housework;Assist for transportation;Help with stairs or ramp for entrance   Can travel by private vehicle        Equipment Recommendations  Rolling walker (2 wheels)    Recommendations for Other Services       Precautions / Restrictions Precautions Precautions: Fall Recall of Precautions/Restrictions: Intact Precaution/Restrictions Comments: no hip precautions per Dr. Elsa Restrictions Weight Bearing Restrictions Per Provider Order: Yes RLE Weight Bearing Per Provider Order: Touchdown weight bearing     Mobility  Bed Mobility Overal bed mobility: Needs Assistance Bed Mobility: Supine to Sit     Supine to sit: Contact guard, HOB elevated     General bed mobility comments: HOB ~30; pt moved slowly but no physical assist    Transfers Overall transfer level: Needs assistance Equipment used: Rolling walker (2 wheels) Transfers: Sit to/from Stand Sit to Stand: Min assist           General transfer comment: pt pushing up with one hand on RW (stabilized by PT) with min boosting assist     Ambulation/Gait Ambulation/Gait assistance: Contact guard assist Gait Distance (Feet): 30 Feet Assistive device: Rolling walker (2 wheels) Gait Pattern/deviations: Step-to pattern       General Gait Details: pt actually maintained NWB>TDWB throughout; good use of UEs to clear LLE during ambulation   Stairs             Wheelchair Mobility     Tilt Bed    Modified Rankin (Stroke Patients Only)       Balance Overall balance assessment: Mild deficits observed, not formally tested                                          Communication Communication Communication: Impaired Factors Affecting Communication: Hearing impaired  Cognition Arousal: Alert Behavior During Therapy: WFL for tasks assessed/performed   PT - Cognitive impairments: No apparent impairments                         Following commands: Intact      Cueing Cueing Techniques: Verbal cues  Exercises      General Comments        Pertinent Vitals/Pain Pain Assessment Pain Assessment: 0-10 Pain Score: 9  Pain Location: Lt thigh Pain Descriptors / Indicators: Cramping, Discomfort, Grimacing, Guarding Pain Intervention(s): Limited activity within patient's tolerance, Monitored during session, Patient requesting pain meds-RN notified, Ice applied    Home Living  Prior Function            PT Goals (current goals can now be found in the care plan section) Acute Rehab PT Goals Patient Stated Goal: find somewhere to stay with fewer steps PT Goal Formulation: With patient Time For Goal Achievement: 07/03/24 Potential to Achieve Goals: Good Progress towards PT goals: Progressing toward goals    Frequency    Min 3X/week      PT Plan      Co-evaluation              AM-PAC PT 6 Clicks Mobility   Outcome Measure  Help needed turning from your back to your side while in a flat bed without using bedrails?:  None Help needed moving from lying on your back to sitting on the side of a flat bed without using bedrails?: A Little Help needed moving to and from a bed to a chair (including a wheelchair)?: A Little Help needed standing up from a chair using your arms (e.g., wheelchair or bedside chair)?: A Little Help needed to walk in hospital room?: A Little Help needed climbing 3-5 steps with a railing? : Total 6 Click Score: 17    End of Session Equipment Utilized During Treatment: Gait belt Activity Tolerance: Patient limited by pain Patient left: in chair;with call bell/phone within reach Nurse Communication: Mobility status;Weight bearing status PT Visit Diagnosis: Other abnormalities of gait and mobility (R26.89)     Time: 1415-1435 PT Time Calculation (min) (ACUTE ONLY): 20 min  Charges:    $Gait Training: 8-22 mins PT General Charges $$ ACUTE PT VISIT: 1 Visit                      Macario RAMAN, PT Acute Rehabilitation Services  Office [phone]    Macario SHAUNNA Soja 06/20/2024, 2:48 PM

## 2024-06-20 NOTE — Progress Notes (Signed)
 Transition of Care [facility]) - CAGE-AID Screening   Patient Details  Name: Timothy Estrada MRN: [id number] Date of Birth: 09-08-1971  [facility] CM/SW Contact:    Bernardino Mayotte, RN Phone Number: 06/20/2024, 8:14 PM   Clinical Narrative:  Patient endorses 1 pint per day of vodka, denies illicit drug use. Patient refused resources at time of screening.  CAGE-AID Screening:    Have You Ever Felt You Ought to Cut Down on Your Drinking or Drug Use?: No Have People Annoyed You By Critizing Your Drinking Or Drug Use?: No Have You Felt Bad Or Guilty About Your Drinking Or Drug Use?: No Have You Ever Had a Drink or Used Drugs First Thing In The Morning to Steady Your Nerves or to Get Rid of a Hangover?: No CAGE-AID Score: 0  Substance Abuse Education Offered: Yes (patient refused)

## 2024-06-21 ENCOUNTER — Other Ambulatory Visit (HOSPITAL_COMMUNITY): Payer: Self-pay

## 2024-06-21 LAB — BASIC METABOLIC PANEL WITH GFR
Anion gap: 11 (ref 5–15)
BUN: 8 mg/dL (ref 6–20)
CO2: 23 mmol/L (ref 22–32)
Calcium: 8.5 mg/dL — ABNORMAL LOW (ref 8.9–10.3)
Chloride: 101 mmol/L (ref 98–111)
Creatinine, Ser: 0.85 mg/dL (ref 0.61–1.24)
GFR, Estimated: >60 mL/min (ref >60)
Glucose, Bld: 98 mg/dL (ref 70–99)
Potassium: 3.4 mmol/L — ABNORMAL LOW (ref 3.5–5.1)
Sodium: 135 mmol/L (ref 135–145)

## 2024-06-21 LAB — CBC
HCT: 35.6 % — ABNORMAL LOW (ref 39.0–52.0)
Hemoglobin: 12.2 g/dL — ABNORMAL LOW (ref 13.0–17.0)
MCH: 31.1 pg (ref 26.0–34.0)
MCHC: 34.3 g/dL (ref 30.0–36.0)
MCV: 90.8 fL (ref 80.0–100.0)
Platelets: 165 K/uL (ref 150–400)
RBC: 3.92 MIL/uL — ABNORMAL LOW (ref 4.22–5.81)
RDW: 12.8 % (ref 11.5–15.5)
WBC: 8.3 K/uL (ref 4.0–10.5)
nRBC: 0 % (ref 0.0–0.2)

## 2024-06-21 LAB — MAGNESIUM: Magnesium: 2 mg/dL (ref 1.7–2.4)

## 2024-06-21 MED ORDER — GABAPENTIN 300 MG PO CAPS
300.0000 mg | ORAL_CAPSULE | Freq: Three times a day (TID) | ORAL | 0 refills | Status: DC | PRN
  Filled 2024-06-21: qty 40, 14d supply, fill #0

## 2024-06-21 MED ORDER — POLYETHYLENE GLYCOL 3350 17 G PO PACK
17.0000 g | PACK | Freq: Every day | ORAL | Status: DC
  Administered 2024-06-21: 17 g via ORAL
  Filled 2024-06-21: qty 1

## 2024-06-21 MED ORDER — IBUPROFEN 800 MG PO TABS: 800.0000 mg | ORAL_TABLET | Freq: Three times a day (TID) | ORAL | Status: DC | PRN

## 2024-06-21 MED ORDER — METHOCARBAMOL 500 MG PO TABS
1000.0000 mg | ORAL_TABLET | Freq: Four times a day (QID) | ORAL | Status: DC
  Administered 2024-06-21: 1000 mg via ORAL
  Filled 2024-06-21: qty 2

## 2024-06-21 MED ORDER — HYDROMORPHONE HCL 1 MG/ML IJ SOLN
0.5000 mg | INTRAMUSCULAR | Status: DC | PRN
  Administered 2024-06-21: 0.5 mg via INTRAVENOUS
  Filled 2024-06-21: qty 0.5

## 2024-06-21 MED ORDER — OXYCODONE HCL 5 MG PO TABS
5.0000 mg | ORAL_TABLET | ORAL | 0 refills | Status: DC | PRN
  Filled 2024-06-21: qty 30, 5d supply, fill #0

## 2024-06-21 MED ORDER — METHOCARBAMOL 500 MG PO TABS
1000.0000 mg | ORAL_TABLET | Freq: Four times a day (QID) | ORAL | 0 refills | Status: DC | PRN
  Filled 2024-06-21: qty 60, 8d supply, fill #0

## 2024-06-21 MED ORDER — ACETAMINOPHEN 500 MG PO TABS
1000.0000 mg | ORAL_TABLET | Freq: Four times a day (QID) | ORAL | Status: AC | PRN
Start: 2024-06-21 — End: ?

## 2024-06-21 NOTE — Plan of Care (Signed)

## 2024-06-21 NOTE — Progress Notes (Signed)
   Subjective/Chief Complaint: Still with pain in his right thigh mostly.  No BM since admission.  + flatus   Objective: Vital signs in last 24 hours: Temp:  [97.7 F (36.5 C)-98.4 F (36.9 C)] 97.7 F (36.5 C) (07/20 2332) Pulse Rate:  [61-79] 63 (07/21 0700) Resp:  [11-19] 18 (07/21 0700) BP: (132-177)/(94-118) 151/94 (07/21 0700) SpO2:  [91 %-97 %] 94 % (07/21 0700) Last BM Date :  (PTA)  Intake/Output from previous day: 07/20 0701 - 07/21 0700 In: 120 [P.O.:120] Out: 1025 [Urine:1025] Intake/Output this shift: No intake/output data recorded.  Alert, oriented, no distress unlabored respirations Abdomen soft and nontender Follows commands x 4 extremities, no gross neurologic deficits Palpable distal pulses, no significant extremity edema  Lab Results:  Recent Labs    06/19/24 0429 06/21/24 0714  WBC 9.6 8.3  HGB 12.2* 12.2*  HCT 36.1* 35.6*  PLT 190 165   BMET Recent Labs    06/19/24 0429 06/21/24 0714  NA 142 135  K 3.8 3.4*  CL 106 101  CO2 26 23  GLUCOSE 86 98  BUN 8 8  CREATININE 0.98 0.85  CALCIUM 8.5* 8.5*   PT/INR No results for input(s): LABPROT, INR in the last 72 hours. ABG No results for input(s): PHART, HCO3 in the last 72 hours.  Invalid input(s): PCO2, PO2  Studies/Results: No results found.   Anti-infectives: Anti-infectives (From admission, onward)    None       Assessment/Plan: 53 year old male status post fall from ladder Pelvic fx - 1Acute displaced right inferior and superior pubic rami fractures extending to the right anterior acetabular wall and pubic symphysis. Asymmetric slightly enlarged and heterogeneous right obturator internus and externus suggestive underlying muscular hematoma. 2. Acute nondisplaced left inferior and superior pubic rami. 3. Acute displaced right sacral ala fracture. Current plan nonop, pain control, PT/OT.  Pain meds adjusted again today.  Touchdown weightbearing on right side  per Dr. Isiah note. Dr. Elsa spoke to Dr. Kendal.  No plans for operative intervention.  Follow up in 2-4 weeks. FEN - regular diet DVT - SCDs, LMWH Dispo - pain controlled, likely home tomorrow.  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 Correlate with physical exam. Consider diagnostic mammography if clinically indicated. - Aortic Atherosclerosis  - Emphysema    LOS: 0 days    Burnard FORBES Banter, PA-C  06/21/2024

## 2024-06-21 NOTE — Progress Notes (Signed)
 Physical Therapy Treatment Patient Details Name: Timothy Estrada MRN: [id number] DOB: 1971/07/07 Today's Date: 06/21/2024   History of Present Illness 53 y.o. male arriving to [facility] ED via [facility] 06/18/24 after 20 ft fall from ladder onto rt side. +ETOH; CT head, c-spine, chest, abdomen/pelvis +bil pubic rami (superior and inferior),  R acetabular, and R sacral ala fxs;    PT Comments  Patient eager to attempt crutches with hopes of discharging home today. Educated in transfers and ambulation with crutches and then had pt return demonstrate. Ambulated 30 ft x2 with CGA. Pt now plans to go to his mother's home with 1 step to enter and reports a friend has already taken some wood over and made him a ramp for the single step. Pt eager to go home today and notified Burnard Banter, PA (on the unit) and his RN.     If plan is discharge home, recommend the following: Assistance with cooking/housework;Assist for transportation;Help with stairs or ramp for entrance   Can travel by private vehicle        Equipment Recommendations  Crutches (obtained and in room)    Recommendations for Other Services       Precautions / Restrictions Precautions Precautions: Fall Recall of Precautions/Restrictions: Intact Precaution/Restrictions Comments: no hip precautions per Dr. Elsa Restrictions Weight Bearing Restrictions Per Provider Order: Yes RLE Weight Bearing Per Provider Order: Touchdown weight bearing LLE Weight Bearing Per Provider Order: Weight bearing as tolerated     Mobility  Bed Mobility               General bed mobility comments: up in chair    Transfers Overall transfer level: Needs assistance Equipment used: Crutches Transfers: Sit to/from Stand Sit to Stand: Contact guard assist, Supervision           General transfer comment: instructed in technique with crutches and return demonstrated x 2    Ambulation/Gait Ambulation/Gait assistance: Contact  guard assist Gait Distance (Feet): 30 Feet (seated rest to don shoes; 30 ft) Assistive device: Crutches Gait Pattern/deviations: Step-to pattern   Gait velocity interpretation: 1.31 - 2.62 ft/sec, indicative of limited community ambulator   General Gait Details: pt actually maintained TDWB throughout; good use of UEs to clear LLE during ambulation   Stairs Stairs:  (pt deferred as friend built a ramp for him; demonstrated 1 step technique in case he doesn't trust ramp)           Wheelchair Mobility     Tilt Bed    Modified Rankin (Stroke Patients Only)       Balance Overall balance assessment: Mild deficits observed, not formally tested                                          Communication Communication Communication: Impaired Factors Affecting Communication: Hearing impaired  Cognition Arousal: Alert Behavior During Therapy: WFL for tasks assessed/performed   PT - Cognitive impairments: No apparent impairments                         Following commands: Intact      Cueing Cueing Techniques: Verbal cues  Exercises      General Comments        Pertinent Vitals/Pain Pain Assessment Pain Assessment: 0-10 Pain Score: 7  Pain Location: Lt thigh Pain Descriptors / Indicators: Cramping, Discomfort,  Grimacing, Guarding Pain Intervention(s): Limited activity within patient's tolerance, Monitored during session, Premedicated before session    Home Living                          Prior Function            PT Goals (current goals can now be found in the care plan section) Acute Rehab PT Goals Patient Stated Goal: find somewhere to stay with fewer steps PT Goal Formulation: With patient Time For Goal Achievement: 07/03/24 Potential to Achieve Goals: Good Progress towards PT goals: Progressing toward goals    Frequency    Min 3X/week      PT Plan      Co-evaluation              AM-PAC PT 6 Clicks  Mobility   Outcome Measure  Help needed turning from your back to your side while in a flat bed without using bedrails?: None Help needed moving from lying on your back to sitting on the side of a flat bed without using bedrails?: A Little Help needed moving to and from a bed to a chair (including a wheelchair)?: A Little Help needed standing up from a chair using your arms (e.g., wheelchair or bedside chair)?: A Little Help needed to walk in hospital room?: A Little Help needed climbing 3-5 steps with a railing? : A Lot 6 Click Score: 18    End of Session Equipment Utilized During Treatment: Gait belt Activity Tolerance: Patient tolerated treatment well Patient left: in chair;with call bell/phone within reach;with chair alarm set Nurse Communication: Mobility status;Weight bearing status;Other (comment) (wants to go home) PT Visit Diagnosis: Other abnormalities of gait and mobility (R26.89)     Time: 8468-8447 PT Time Calculation (min) (ACUTE ONLY): 21 min  Charges:    $Gait Training: 8-22 mins PT General Charges $$ ACUTE PT VISIT: 1 Visit                      Macario RAMAN, PT Acute Rehabilitation Services  Office [phone]    Macario SHAUNNA Soja 06/21/2024, 4:10 PM

## 2024-06-21 NOTE — Progress Notes (Signed)
 Patient ID: Timothy Estrada, male   DOB: 1971/05/27, 53 y.o.   MRN: [id number]   LOS: 0 days   Subjective: Continues to have sig pain both in pelvis and bilateral quads. Legs only hurt with movement.   Objective: Vital signs in last 24 hours: Temp:  [97.7 F (36.5 C)-97.9 F (36.6 C)] 97.7 F (36.5 C) (07/20 2332) Pulse Rate:  [61-79] 63 (07/21 0700) Resp:  [11-19] 18 (07/21 0700) BP: (132-177)/(94-107) 151/94 (07/21 0700) SpO2:  [91 %-97 %] 94 % (07/21 0700) Last BM Date :  (PTA)   Laboratory  CBC Recent Labs    06/19/24 0429 06/21/24 0714  WBC 9.6 8.3  HGB 12.2* 12.2*  HCT 36.1* 35.6*  PLT 190 165   BMET Recent Labs    06/19/24 0429 06/21/24 0714  NA 142 135  K 3.8 3.4*  CL 106 101  CO2 26 23  GLUCOSE 86 98  BUN 8 8  CREATININE 0.98 0.85  CALCIUM 8.5* 8.5*     Physical Exam General appearance: alert and no distress LLE: No TTP, able to SLR but w/extreme pain, able to flex hip about 15 degrees with mod pain, no pain with PROM   Assessment/Plan: LLE pain -- Source of pain seems muscular, already on scheduled muscle relaxer. Could consider lidocaine  patch. Likely will resolve with time.    Ozell DOROTHA Ned, PA-C Orthopedic Surgery [phone] 06/21/2024

## 2024-06-21 NOTE — Plan of Care (Signed)
  Problem: Education: Goal: Knowledge of General Education information will improve Description: Including pain rating scale, medication(s)/side effects and non-pharmacologic comfort measures 06/21/2024 0405 by Marvis Kenneth SAILOR, RN Outcome: Progressing 06/20/2024 2026 by Marvis Kenneth SAILOR, RN Outcome: Progressing   Problem: Health Behavior/Discharge Planning: Goal: Ability to manage health-related needs will improve 06/21/2024 0405 by Marvis Kenneth SAILOR, RN Outcome: Progressing 06/20/2024 2026 by Marvis Kenneth SAILOR, RN Outcome: Progressing   Problem: Clinical Measurements: Goal: Ability to maintain clinical measurements within normal limits will improve 06/21/2024 0405 by Marvis Kenneth SAILOR, RN Outcome: Progressing 06/20/2024 2026 by Marvis Kenneth SAILOR, RN Outcome: Progressing Goal: Will remain free from infection 06/21/2024 0405 by Marvis Kenneth SAILOR, RN Outcome: Progressing 06/20/2024 2026 by Marvis Kenneth SAILOR, RN Outcome: Progressing Goal: Diagnostic test results will improve 06/21/2024 0405 by Marvis Kenneth SAILOR, RN Outcome: Progressing 06/20/2024 2026 by Marvis Kenneth SAILOR, RN Outcome: Progressing Goal: Respiratory complications will improve 06/21/2024 0405 by Marvis Kenneth SAILOR, RN Outcome: Progressing 06/20/2024 2026 by Marvis Kenneth SAILOR, RN Outcome: Progressing Goal: Cardiovascular complication will be avoided 06/21/2024 0405 by Marvis Kenneth SAILOR, RN Outcome: Progressing 06/20/2024 2026 by Marvis Kenneth SAILOR, RN Outcome: Progressing   Problem: Activity: Goal: Risk for activity intolerance will decrease 06/21/2024 0405 by Marvis Kenneth SAILOR, RN Outcome: Progressing 06/20/2024 2026 by Marvis Kenneth SAILOR, RN Outcome: Progressing   Problem: Nutrition: Goal: Adequate nutrition will be maintained 06/21/2024 0405 by Marvis Kenneth SAILOR, RN Outcome: Progressing 06/20/2024 2026 by Marvis Kenneth SAILOR, RN Outcome: Progressing   Problem: Coping: Goal: Level of anxiety will  decrease 06/21/2024 0405 by Marvis Kenneth SAILOR, RN Outcome: Progressing 06/20/2024 2026 by Marvis Kenneth SAILOR, RN Outcome: Progressing   Problem: Elimination: Goal: Will not experience complications related to bowel motility 06/21/2024 0405 by Marvis Kenneth SAILOR, RN Outcome: Progressing 06/20/2024 2026 by Marvis Kenneth SAILOR, RN Outcome: Progressing Goal: Will not experience complications related to urinary retention 06/21/2024 0405 by Marvis Kenneth SAILOR, RN Outcome: Progressing 06/20/2024 2026 by Marvis Kenneth SAILOR, RN Outcome: Progressing   Problem: Pain Managment: Goal: General experience of comfort will improve and/or be controlled 06/21/2024 0405 by Marvis Kenneth SAILOR, RN Outcome: Progressing 06/20/2024 2026 by Marvis Kenneth SAILOR, RN Outcome: Progressing   Problem: Safety: Goal: Ability to remain free from injury will improve 06/21/2024 0405 by Marvis Kenneth SAILOR, RN Outcome: Progressing 06/20/2024 2026 by Marvis Kenneth SAILOR, RN Outcome: Progressing   Problem: Skin Integrity: Goal: Risk for impaired skin integrity will decrease 06/21/2024 0405 by Marvis Kenneth SAILOR, RN Outcome: Progressing 06/20/2024 2026 by Marvis Kenneth SAILOR, RN Outcome: Progressing

## 2024-06-21 NOTE — Progress Notes (Signed)
 PT Cancellation Note  Patient Details Name: Timothy Estrada MRN: [id number] DOB: Dec 13, 1970   Cancelled Treatment:    Reason Eval/Treat Not Completed: Pain limiting ability to participate  Patient seen by OT and requiring incr assist due to extreme left LE and sacral pain. RN messaging MD and plans to see pt again and ?rescan. Will await further MD assessment.   Macario RAMAN, PT Acute Rehabilitation Services  Office [phone]  Macario SHAUNNA Soja 06/21/2024, 11:28 AM

## 2024-06-21 NOTE — Discharge Instructions (Signed)
 Touchdown weightbearing on the right lower extremity Weightbearing as tolerates to the left lower extremity

## 2024-06-21 NOTE — TOC Initial Note (Addendum)
 Transition of Care [facility]) - Initial/Assessment Note    Patient Details  Name: Timothy Estrada MRN: [id number] Date of Birth: 1971/10/19  Transition of Care [facility]) CM/SW Contact:    Neil Errickson E Jovaughn Wojtaszek, LCSW Phone Number: 06/21/2024, 9:34 AM  Clinical Narrative:                 CSW met with patient at bedside to discuss therapy recs and SA consult. Patient lives with his sister, however plans to stay with his mother at [place] due to her not having any stairs. Address is [address], [place].  Patient is uninsured and would like to be screened for [facility] - message sent to Financial Navigator requesting follow up. Patient does not have a PCP, he would like a referral for a PCP - message sent to CMA for scheduling.  Patient denies HH or DME history. Patient is agreeable to [facility], RW, and tub bench recs. Asked RNCM if patient is eligible for charity [facility] and DME - if not, patient states he is ok with OPPT being arranged. Requested RNCM to follow up.  Patient states he has a good support system including friends and family who can provide transportation.  Patient denies SA resource needs.   Expected Discharge Plan: Home w Home Health Services Barriers to Discharge: Continued Medical Work up   Patient Goals and CMS Choice Patient states their goals for this hospitalization and ongoing recovery are:: home with mother CMS Medicare.gov Compare Post Acute Care list provided to:: Patient Choice offered to / list presented to : Patient      Expected Discharge Plan and Services       Living arrangements for the past 2 months: Single Family Home                                      Prior Living Arrangements/Services Living arrangements for the past 2 months: Single Family Home Lives with:: Relatives Patient language and need for interpreter reviewed:: Yes Do you feel safe going back to the place where you live?: Yes      Need for Family Participation in Patient Care: Yes  (Comment) Care giver support system in place?: Yes (comment)   Criminal Activity/Legal Involvement Pertinent to Current Situation/Hospitalization: No - Comment as needed  Activities of Daily Living      Permission Sought/Granted Permission sought to share information with : Oceanographer granted to share information with : Yes, Verbal Permission Granted     Permission granted to share info w AGENCY: HH, DME agencies  Permission granted to share info w Relationship: mother     Emotional Assessment       Orientation: : Oriented to Self, Oriented to Place, Oriented to  Time, Oriented to Situation Alcohol  / Substance Use: Not Applicable Psych Involvement: No (comment)  Admission diagnosis:  Fall [W19.XXXA] Closed bilateral fracture of pubic rami, initial encounter (HCC) [S32.591A, S32.592A] Closed displaced fracture of right acetabulum, unspecified portion of acetabulum, initial encounter [facility]) [S32.401A] Patient Active Problem List   Diagnosis Date Noted   Fall 06/19/2024   Alcohol  use disorder, severe, dependence (HCC) 06/12/2023   PCP:  Pcp, No Pharmacy:   [id number] - [place], [place] - [address] [place] [address] Phone: [phone] Fax: [phone]  [facility] [id number], [place] - [address] [place]  [place] [address] Phone: [phone] Fax: [phone]     Social Drivers of Health (SDOH) Social History: SDOH Screenings   Food Insecurity: No Food Insecurity (06/20/2024)  Housing: Low Risk  (06/20/2024)  Transportation Needs: No Transportation Needs (06/20/2024)  Utilities: Not At Risk (06/20/2024)  Depression (PHQ2-9): Low Risk  (06/16/2023)  Tobacco Use: High Risk (02/03/2024)   SDOH Interventions:     Readmission Risk Interventions     No data to display

## 2024-06-21 NOTE — Progress Notes (Signed)
 Occupational Therapy Treatment Patient Details Name: LORCAN SHELP MRN: [id number] DOB: October 01, 1971 Today's Date: 06/21/2024   History of present illness 53 y.o. male arriving to [facility] ED via [facility] 06/18/24 after 20 ft fall from ladder onto rt side. +ETOH; CT head, c-spine, chest, abdomen/pelvis +bil pubic rami (superior and inferior),  R acetabular, and R sacral ala fxs;   OT comments  Pt progressing toward established OT goals; however, requiring more assist with transfers this session; pain limited and likely to move better when has had pain medication. Education provided regarding AE for LB ADL with AE provided. Pt performing with min A. Will continue to follow acutely. PT updated to OP PT; will defer post-acute rehab services to post-acute PT. OT to continue to follow acutely       If plan is discharge home, recommend the following:  A little help with walking and/or transfers;A little help with bathing/dressing/bathroom;Assistance with cooking/housework;Assist for transportation;Help with stairs or ramp for entrance   Equipment Recommendations  Tub/shower bench    Recommendations for Other Services      Precautions / Restrictions Precautions Precautions: Fall Recall of Precautions/Restrictions: Intact Precaution/Restrictions Comments: no hip precautions per Dr. Elsa Restrictions Weight Bearing Restrictions Per Provider Order: Yes RLE Weight Bearing Per Provider Order: Touchdown weight bearing LLE Weight Bearing Per Provider Order: Weight bearing as tolerated       Mobility Bed Mobility Overal bed mobility: Needs Assistance Bed Mobility: Supine to Sit     Supine to sit: Min assist     General bed mobility comments: for LE management    Transfers Overall transfer level: Needs assistance Equipment used: Rolling walker (2 wheels) Transfers: Sit to/from Stand Sit to Stand: Mod assist           General transfer comment: up from EOB; predominanlty  seems to be limited this session by pain     Balance Overall balance assessment: Mild deficits observed, not formally tested                                         ADL either performed or assessed with clinical judgement   ADL Overall ADL's : Needs assistance/impaired                     Lower Body Dressing: Minimal assistance;Sitting/lateral leans Lower Body Dressing Details (indicate cue type and reason): to doff/don socks with reacher and sock aid. Assistance predominantly due to pain with repositioning BLE             Functional mobility during ADLs: Minimal assistance;Rolling walker (2 wheels)      Extremity/Trunk Assessment Upper Extremity Assessment Upper Extremity Assessment: Overall WFL for tasks assessed   Lower Extremity Assessment Lower Extremity Assessment: Defer to PT evaluation        Vision       Perception     Praxis     Communication Communication Communication: Impaired Factors Affecting Communication: Hearing impaired   Cognition Arousal: Alert Behavior During Therapy: WFL for tasks assessed/performed Cognition: No apparent impairments                               Following commands: Intact        Cueing   Cueing Techniques: Verbal cues  Exercises      Shoulder Instructions  General Comments      Pertinent Vitals/ Pain       Pain Assessment Pain Assessment: 0-10 Pain Score: 10-Worst pain ever Pain Location: Lt thigh, sacrum Pain Descriptors / Indicators: Cramping, Discomfort, Grimacing, Guarding Pain Intervention(s): Limited activity within patient's tolerance  Home Living                                          Prior Functioning/Environment              Frequency  Min 2X/week        Progress Toward Goals  OT Goals(current goals can now be found in the care plan section)  Progress towards OT goals: Progressing toward goals (limited by pain this  session, but knowledge of compensatory techniques progression)  Acute Rehab OT Goals Patient Stated Goal: get better OT Goal Formulation: With patient Time For Goal Achievement: 07/03/24 Potential to Achieve Goals: Good ADL Goals Pt Will Perform Lower Body Dressing: with supervision;sit to/from stand Pt Will Transfer to Toilet: with supervision;ambulating Pt Will Perform Tub/Shower Transfer: Tub transfer;with supervision;tub bench  Plan      Co-evaluation                 AM-PAC OT 6 Clicks Daily Activity     Outcome Measure   Help from another person eating meals?: None Help from another person taking care of personal grooming?: A Little Help from another person toileting, which includes using toliet, bedpan, or urinal?: A Little Help from another person bathing (including washing, rinsing, drying)?: A Little Help from another person to put on and taking off regular upper body clothing?: A Little Help from another person to put on and taking off regular lower body clothing?: A Little 6 Click Score: 19    End of Session Equipment Utilized During Treatment: Gait belt;Rolling walker (2 wheels)  OT Visit Diagnosis: Unsteadiness on feet (R26.81);Muscle weakness (generalized) (M62.81);Pain Pain - Right/Left: Left Pain - part of body: Hip   Activity Tolerance Patient tolerated treatment well   Patient Left in chair;with call bell/phone within reach;with chair alarm set   Nurse Communication Mobility status        Time: 1040-1107 OT Time Calculation (min): 27 min  Charges: OT General Charges $OT Visit: 1 Visit OT Treatments $Self Care/Home Management : 23-37 mins  [name] FREDERICK, OTR/L [facility] Acute Rehabilitation Office: [phone]   [name] 06/21/2024, 4:22 PM

## 2024-06-21 NOTE — Discharge Summary (Signed)
 Patient ID: Timothy Estrada [id number] 29-Jan-1971 53 y.o.  Admit date: 06/18/2024 Discharge date: 06/21/2024  Admitting Diagnosis: [facility] Pelvic fx  R thigh pain  Discharge Diagnosis Patient Active Problem List   Diagnosis Date Noted   Fall 06/19/2024   Alcohol  use disorder, severe, dependence (HCC) 06/12/2023  [facility] Pelvic fx  R thigh pain  Consultants Dr. Elsa - ortho  Reason for Admission: 29M was on a ladder, coming down off his roof from getting sticks off. Denies LOC or hitting his head. Noted pelvic fx, nonop per ortho, but pain control too poor to be discharged per EDP. Report B thigh pain R>L and pelvic pain, inproved since ED and getting pain medication.   Lives with parents and sister in [place]. No steps to enter and no steps inside the home.    1ppd, declines nicotine  patch, drinks a pint of vodka a day, no recreational drug use  Procedures none  Hospital Course:  The patient was admitted and seen by ortho with the recommendations for non-operative management.  He worked with therapies who recommended [facility] PT/OT, but this was unable to be obtained so outpatient therapies were set up.  DME was also set up.  Pain was controlled with multi-modal medications.  He was stable on HD 3 for DC home with appropriate follow up made.   Allergies as of 06/21/2024   No Known Allergies      Medication List     TAKE these medications    acetaminophen  500 MG tablet Commonly known as: TYLENOL  Take 2 tablets (1,000 mg total) by mouth every 6 (six) hours as needed.   gabapentin  300 MG capsule Commonly known as: NEURONTIN  Take 1 capsule (300 mg total) by mouth 3 (three) times daily as needed.   ibuprofen  800 MG tablet Commonly known as: ADVIL  Take 1 tablet (800 mg total) by mouth every 8 (eight) hours as needed.   methocarbamol  500 MG tablet Commonly known as: ROBAXIN  Take 2 tablets (1,000 mg total) by mouth every 6 (six) hours as needed for muscle spasms.   oxyCODONE  5  MG immediate release tablet Commonly known as: Oxy IR/ROXICODONE  Take 1-2 tablets (5-10 mg total) by mouth every 4 (four) hours as needed for moderate pain (pain score 4-6) or severe pain (pain score 7-10) (5mg  for moderate pain, 10mg  for severe pain).               Durable Medical Equipment  (From admission, onward)           Start     Ordered   06/21/24 1022  For home use only DME Walker rolling  Once       Question Answer Comment  Walker: With 5 Inch Wheels   Patient needs a walker to treat with the following condition Pelvic fracture (HCC)      06/21/24 1021   06/21/24 1022  For home use only DME Tub bench  Once        06/21/24 1021              Follow-up Information     Elsa Lonni SAUNDERS, MD .   Specialty: Orthopedic Surgery Contact information: [address] [place] [address] [phone]         Leavy Lucas Fox, PA-C Follow up.   Specialty: Physician Assistant Why: TIME :  10: 00 AM  PLEASE ARRIVE AT 9:30 AM DATE :  AUG 14 , 2025  THURSDAY  PLEASE BRING ALL MEDICATION, ID and  INS CARD Contact information: [address] [place] [address] [phone]         [facility] Follow up.   Specialty: Rehabilitation Why: Someone from Outpatient Rehab at [facility] will contact you to arrange start date for your therapy. Contact information: [address] [address] [place]  [phone]                Signed: Burnard Banter, [facility] 06/21/2024, 4:09 PM Please see Amion for pager number during day hours 7:00am-4:30pm, 7-11:30am on Weekends

## 2024-06-21 NOTE — TOC Progression Note (Addendum)
 Transition of Care [facility]) - Progression Note    Patient Details  Name: Timothy Estrada MRN: [id number] Date of Birth: 27-Mar-1971  Transition of Care [facility]) CM/SW Contact  Nola Devere Hands, RN Phone Number: 06/21/2024, 4:23 PM  Clinical Narrative:    Case Manager entered Match for patient.   Group 10, Grouped objectMATCH MEDICATION ASSISTANCE CARD   Pharmacies please call [phone] for claim processing assistance.   Rx BIN: [id number]   Rx Group: [id number]   Rx PCN: [id number]   Relationship Code: 1   Person Code: [id number]   Patient ID (MRN): MOSES       Patient Name: Timothy Estrada    Patient DOB: [id number]    Discharge Date: 06-21-24    Expiration Date: 06-27-24  (must be filled within 7 days of discharge)   ** MATCH will not be used, patient has prescription coverage and his cost is $8.87 and he told PA that his sister will pay when she comes to get him.     Expected Discharge Plan: Home/Self Care Barriers to Discharge: No Barriers Identified  Expected Discharge Plan and Services   Discharge Planning Services: [facility] Program Post Acute Care Choice: Durable Medical Equipment Living arrangements for the past 2 months: Single Family Home Expected Discharge Date: 06/21/24               DME Arranged: Tub bench DME Agency: [employer] Date DME Agency Contacted: 06/21/24 Time DME Agency Contacted: 1255 Representative spoke with at DME Agency: London Budge HH Arranged: NA HH Agency: NA         Social Determinants of Health (SDOH) Interventions SDOH Screenings   Food Insecurity: No Food Insecurity (06/20/2024)  Housing: Low Risk  (06/20/2024)  Transportation Needs: No Transportation Needs (06/20/2024)  Utilities: Not At Risk (06/20/2024)  Depression (PHQ2-9): Low Risk  (06/16/2023)  Tobacco Use: High Risk (02/03/2024)    Readmission Risk Interventions     No data to display

## 2024-06-21 NOTE — Progress Notes (Signed)
 I spoke with Dr. Kendal with orthopedic traumatology and he reviewed the imaging.  He agrees with nonoperative treatment for this patient's pelvic fractures.  Patient will continue with touchdown weightbearing on the right side.  He will follow-up with me as an outpatient in 2 to 4 weeks for repeat imaging.  Okay to discharge from orthopedic standpoint once cleared per trauma service.

## 2024-06-21 NOTE — TOC Progression Note (Addendum)
 Transition of Care [facility]) - Progression Note    Patient Details  Name: Timothy Estrada MRN: [id number] Date of Birth: 10/30/1971  Transition of Care [facility]) CM/SW Contact  Nola Devere Hands, RN Phone Number: 06/21/2024, 12:56 PM  Clinical Narrative:    Case manager spoke with patient concerning outpatient therapy. [facility] is preferred, referral has been sent. CM has requested RW and tub bench to be delivered to patient room.   1328: patient will receive charity RW, can not get tub bench per [employer].   Expected Discharge Plan: OP Rehab Barriers to Discharge: Continued Medical Work up  Expected Discharge Plan and Services   Discharge Planning Services: CM Consult Post Acute Care Choice: Durable Medical Equipment Living arrangements for the past 2 months: Single Family Home                 DME Arranged: Walker rolling DME Agency: [employer] Date DME Agency Contacted: 06/21/24 Time DME Agency Contacted: 1255 Representative spoke with at DME Agency: London Budge HH Arranged: NA HH Agency: NA         Social Determinants of Health (SDOH) Interventions SDOH Screenings   Food Insecurity: No Food Insecurity (06/20/2024)  Housing: Low Risk  (06/20/2024)  Transportation Needs: No Transportation Needs (06/20/2024)  Utilities: Not At Risk (06/20/2024)  Depression (PHQ2-9): Low Risk  (06/16/2023)  Tobacco Use: High Risk (02/03/2024)    Readmission Risk Interventions     No data to display

## 2024-07-15 ENCOUNTER — Ambulatory Visit (INDEPENDENT_AMBULATORY_CARE_PROVIDER_SITE_OTHER): Payer: Self-pay

## 2024-07-15 VITALS — BP 154/104 | HR 81

## 2024-07-15 DIAGNOSIS — Z09 Encounter for follow-up examination after completed treatment for conditions other than malignant neoplasm: Secondary | ICD-10-CM

## 2024-07-15 DIAGNOSIS — W132XXA Fall from, out of or through roof, initial encounter: Secondary | ICD-10-CM

## 2024-07-15 DIAGNOSIS — W11XXXA Fall on and from ladder, initial encounter: Secondary | ICD-10-CM

## 2024-07-15 DIAGNOSIS — M25551 Pain in right hip: Secondary | ICD-10-CM

## 2024-07-15 DIAGNOSIS — Z7689 Persons encountering health services in other specified circumstances: Secondary | ICD-10-CM

## 2024-07-15 MED ORDER — GABAPENTIN 300 MG PO CAPS: 300.0000 mg | ORAL_CAPSULE | Freq: Two times a day (BID) | ORAL | 0 refills | Status: DC

## 2024-07-15 NOTE — Progress Notes (Addendum)
     Patient ID: KENTREL CLEVENGER, male    DOB: 1971/05/26  MRN: [id number]  CC: Medication Refill, New Patient (Initial Visit), and insomina    Subjective: Timothy Estrada is a 53 y.o. male who presents to clinic to establish care and post discharge from hospital follow up. He states that he was on top of a 20 foot ladder trying to clean the gutters. He accidentally Estrada off on 06/18/24 and landed on the right side. Presented to ER that day and was disharged on 7/21. Ortho decided on non-operative management of pelvic fracture. Pt reports pain is still present on right hip. He has not begun physical therapy. Has attempted standing with crutches and was able to take a few steps. Currently taking ibuprofen and tylenol only when pain gets really bad. Experiences 10/10 right hip pain in the mornings. Has developed tailbone pain from sitting for too long, is performing pressure relief by shifting body weight in chair.    Patient Active Problem List   Diagnosis Date Noted   Fall 06/19/2024   Alcohol use disorder, severe, dependence (HCC) 06/12/2023     ROS: Review of Systems Negative except as stated above  PHYSICAL EXAM: BP (!) 154/104 (BP Location: Right Arm, Patient Position: Sitting, Cuff Size: Normal)   Pulse 81   SpO2 97%   Physical Exam  General: well-appearing, no acute distress. Presents seated in wheelchair. Skin: no jaundice, rashes, or lesions Cardiovascular: regular heart rate and rhythm Chest: no skeletal deformity, lungs clear to auscultation bilaterally, equal breath sounds bilaterally Musculoskeletal: Propels manual w/c without assistance Extremities: no peripheral edema. Pt declined hip examination.   ASSESSMENT AND PLAN:  1. Encounter to establish care with new provider (Primary)  2. Right Hip pain, acute, due to fall from roof  - Pt educated on medication management for optimum pain management. Currently pt takes pain medication when pain is 10/10 on pain scale.  Instructed to alternate ibuprofen and tylenol every 3 hours to maintain a constant level of pain relief. Written instruction provided. Pt expressed understanding. - Instructed to place cushion on w/c to prevent pressure injury from prolonged sitting.  - Pt encouraged to call to set up physical therapy appointment. Reports he has contact information. Needs updated weight bearing instructions.    Patient was given the opportunity to ask questions.  Patient verbalized understanding of the plan and was able to repeat key elements of the plan. Patient was given clear instructions to go to Emergency Department or return to medical center if symptoms don't improve, worsen, or new problems develop.The patient verbalized understanding.     Requested Prescriptions   Signed Prescriptions Disp Refills   gabapentin (NEURONTIN) 300 MG capsule 60 capsule 0    Sig: Take 1 capsule (300 mg total) by mouth 2 (two) times daily.    Return in about 1 month (around 08/15/2024) for physical, labs.  Sula Leavy Rode, PA-C

## 2024-08-11 ENCOUNTER — Other Ambulatory Visit: Payer: Self-pay

## 2024-08-16 ENCOUNTER — Other Ambulatory Visit: Payer: Self-pay

## 2024-08-17 ENCOUNTER — Encounter

## 2024-09-09 ENCOUNTER — Ambulatory Visit (HOSPITAL_COMMUNITY)
Admission: EM | Admit: 2024-09-09 | Discharge: 2024-09-09 | Disposition: A | Discharge status: Other Acute Inpatient Hospital | Attending: Psychiatry | Admitting: Psychiatry

## 2024-09-09 ENCOUNTER — Other Ambulatory Visit (HOSPITAL_COMMUNITY)
Admission: EM | Admit: 2024-09-09 | Discharge: 2024-09-15 | Disposition: A | Discharge status: Home / Self Care | Source: Intra-hospital

## 2024-09-09 DIAGNOSIS — F39 Unspecified mood [affective] disorder: Secondary | ICD-10-CM | POA: Insufficient documentation

## 2024-09-09 DIAGNOSIS — F1090 Alcohol use, unspecified, uncomplicated: Secondary | ICD-10-CM

## 2024-09-09 DIAGNOSIS — F17210 Nicotine dependence, cigarettes, uncomplicated: Secondary | ICD-10-CM | POA: Insufficient documentation

## 2024-09-09 DIAGNOSIS — F1290 Cannabis use, unspecified, uncomplicated: Secondary | ICD-10-CM

## 2024-09-09 DIAGNOSIS — F1020 Alcohol dependence, uncomplicated: Secondary | ICD-10-CM | POA: Insufficient documentation

## 2024-09-09 DIAGNOSIS — F411 Generalized anxiety disorder: Secondary | ICD-10-CM

## 2024-09-09 LAB — POCT URINE DRUG SCREEN - MANUAL ENTRY (I-SCREEN)
POC Amphetamine UR: NOT DETECTED
POC Buprenorphine (BUP): NOT DETECTED
POC Cocaine UR: NOT DETECTED
POC Marijuana UR: POSITIVE — AB
POC Methadone UR: NOT DETECTED
POC Methamphetamine UR: NOT DETECTED
POC Morphine: NOT DETECTED
POC Oxazepam (BZO): NOT DETECTED
POC Oxycodone UR: NOT DETECTED
POC Secobarbital (BAR): NOT DETECTED

## 2024-09-09 LAB — CBC WITH DIFFERENTIAL/PLATELET
Abs Immature Granulocytes: 0.02 K/uL (ref 0.00–0.07)
Basophils Absolute: 0.1 K/uL (ref 0.0–0.1)
Basophils Relative: 1 %
Eosinophils Absolute: 0.1 K/uL (ref 0.0–0.5)
Eosinophils Relative: 1 %
HCT: 45.7 % (ref 39.0–52.0)
Hemoglobin: 15.1 g/dL (ref 13.0–17.0)
Immature Granulocytes: 0 %
Lymphocytes Relative: 20 %
Lymphs Abs: 1.7 K/uL (ref 0.7–4.0)
MCH: 30.9 pg (ref 26.0–34.0)
MCHC: 33 g/dL (ref 30.0–36.0)
MCV: 93.5 fL (ref 80.0–100.0)
Monocytes Absolute: 0.9 K/uL (ref 0.1–1.0)
Monocytes Relative: 10 %
Neutro Abs: 6.2 K/uL (ref 1.7–7.7)
Neutrophils Relative %: 68 %
Platelets: 356 K/uL (ref 150–400)
RBC: 4.89 MIL/uL (ref 4.22–5.81)
RDW: 13.2 % (ref 11.5–15.5)
WBC: 8.9 K/uL (ref 4.0–10.5)
nRBC: 0 % (ref 0.0–0.2)

## 2024-09-09 LAB — COMPREHENSIVE METABOLIC PANEL WITH GFR
ALT: 28 U/L (ref 0–44)
AST: 28 U/L (ref 15–41)
Albumin: 4.4 g/dL (ref 3.5–5.0)
Alkaline Phosphatase: 105 U/L (ref 38–126)
Anion gap: 11 (ref 5–15)
BUN: 11 mg/dL (ref 6–20)
CO2: 27 mmol/L (ref 22–32)
Calcium: 9.7 mg/dL (ref 8.9–10.3)
Chloride: 98 mmol/L (ref 98–111)
Creatinine, Ser: 0.93 mg/dL (ref 0.61–1.24)
GFR, Estimated: >60 mL/min (ref >60)
Glucose, Bld: 91 mg/dL (ref 70–99)
Potassium: 4.9 mmol/L (ref 3.5–5.1)
Sodium: 136 mmol/L (ref 135–145)
Total Bilirubin: 1.4 mg/dL — ABNORMAL HIGH (ref 0.0–1.2)
Total Protein: 7.7 g/dL (ref 6.5–8.1)

## 2024-09-09 LAB — LIPID PANEL
Cholesterol: 237 mg/dL — ABNORMAL HIGH (ref 0–200)
HDL: 107 mg/dL (ref >40)
LDL Cholesterol: 110 mg/dL — ABNORMAL HIGH (ref 0–99)
Total CHOL/HDL Ratio: 2.2 ratio
Triglycerides: 99 mg/dL (ref <150)
VLDL: 20 mg/dL (ref 0–40)

## 2024-09-09 LAB — TSH: TSH: 2.609 u[IU]/mL (ref 0.350–4.500)

## 2024-09-09 LAB — HEMOGLOBIN A1C
Hgb A1c MFr Bld: 4.7 % — ABNORMAL LOW (ref 4.8–5.6)
Mean Plasma Glucose: 88.19 mg/dL

## 2024-09-09 LAB — ETHANOL: Alcohol, Ethyl (B): <15 mg/dL (ref <15)

## 2024-09-09 LAB — MAGNESIUM: Magnesium: 2.3 mg/dL (ref 1.7–2.4)

## 2024-09-09 MED ORDER — LORAZEPAM 2 MG/ML IJ SOLN: 2.0000 mg | Freq: Three times a day (TID) | INTRAMUSCULAR | Status: DC | PRN

## 2024-09-09 MED ORDER — DIPHENHYDRAMINE HCL 50 MG/ML IJ SOLN: 50.0000 mg | Freq: Three times a day (TID) | INTRAMUSCULAR | Status: DC | PRN

## 2024-09-09 MED ORDER — HALOPERIDOL LACTATE 5 MG/ML IJ SOLN: 10.0000 mg | Freq: Three times a day (TID) | INTRAMUSCULAR | Status: DC | PRN

## 2024-09-09 MED ORDER — ACETAMINOPHEN 325 MG PO TABS: 650.0000 mg | ORAL_TABLET | Freq: Four times a day (QID) | ORAL | Status: DC | PRN

## 2024-09-09 MED ORDER — LORAZEPAM 1 MG PO TABS
1.0000 mg | ORAL_TABLET | Freq: Three times a day (TID) | ORAL | Status: AC
  Administered 2024-09-11 – 2024-09-12 (×3): 1 mg via ORAL
  Filled 2024-09-09 (×3): qty 1

## 2024-09-09 MED ORDER — TRAZODONE HCL 50 MG PO TABS
50.0000 mg | ORAL_TABLET | Freq: Every evening | ORAL | Status: DC | PRN
  Administered 2024-09-09: 50 mg via ORAL
  Filled 2024-09-09: qty 1

## 2024-09-09 MED ORDER — ADULT MULTIVITAMIN W/MINERALS CH
1.0000 | ORAL_TABLET | Freq: Every day | ORAL | Status: DC
  Administered 2024-09-10 – 2024-09-15 (×6): 1 via ORAL
  Filled 2024-09-09 (×6): qty 1

## 2024-09-09 MED ORDER — ALUM & MAG HYDROXIDE-SIMETH 200-200-20 MG/5ML PO SUSP: 30.0000 mL | ORAL | Status: DC | PRN

## 2024-09-09 MED ORDER — ACETAMINOPHEN 325 MG PO TABS
650.0000 mg | ORAL_TABLET | Freq: Four times a day (QID) | ORAL | Status: DC | PRN
  Administered 2024-09-10 – 2024-09-14 (×4): 650 mg via ORAL
  Filled 2024-09-09 (×4): qty 2

## 2024-09-09 MED ORDER — HALOPERIDOL 5 MG PO TABS: 5.0000 mg | ORAL_TABLET | Freq: Three times a day (TID) | ORAL | Status: DC | PRN

## 2024-09-09 MED ORDER — NICOTINE 21 MG/24HR TD PT24: 21.0000 mg | MEDICATED_PATCH | Freq: Every day | TRANSDERMAL | Status: DC

## 2024-09-09 MED ORDER — ADULT MULTIVITAMIN W/MINERALS CH
1.0000 | ORAL_TABLET | Freq: Every day | ORAL | Status: DC
  Administered 2024-09-09: 1 via ORAL
  Filled 2024-09-09: qty 1

## 2024-09-09 MED ORDER — HYDROXYZINE HCL 25 MG PO TABS: 25.0000 mg | ORAL_TABLET | Freq: Four times a day (QID) | ORAL | Status: AC | PRN

## 2024-09-09 MED ORDER — LORAZEPAM 1 MG PO TABS
1.0000 mg | ORAL_TABLET | Freq: Four times a day (QID) | ORAL | Status: AC
  Administered 2024-09-10 – 2024-09-11 (×6): 1 mg via ORAL
  Filled 2024-09-09 (×6): qty 1

## 2024-09-09 MED ORDER — HYDROXYZINE HCL 25 MG PO TABS: 25.0000 mg | ORAL_TABLET | Freq: Three times a day (TID) | ORAL | Status: DC | PRN

## 2024-09-09 MED ORDER — THIAMINE HCL 100 MG/ML IJ SOLN
100.0000 mg | Freq: Once | INTRAMUSCULAR | Status: AC
  Administered 2024-09-09: 100 mg via INTRAMUSCULAR
  Filled 2024-09-09: qty 2

## 2024-09-09 MED ORDER — CLONIDINE HCL 0.1 MG PO TABS: 0.1000 mg | ORAL_TABLET | Freq: Two times a day (BID) | ORAL | Status: DC | PRN

## 2024-09-09 MED ORDER — HALOPERIDOL LACTATE 5 MG/ML IJ SOLN: 5.0000 mg | Freq: Three times a day (TID) | INTRAMUSCULAR | Status: DC | PRN

## 2024-09-09 MED ORDER — THIAMINE MONONITRATE 100 MG PO TABS
100.0000 mg | ORAL_TABLET | Freq: Every day | ORAL | Status: DC
  Administered 2024-09-10 – 2024-09-15 (×6): 100 mg via ORAL
  Filled 2024-09-09 (×6): qty 1

## 2024-09-09 MED ORDER — LORAZEPAM 1 MG PO TABS
1.0000 mg | ORAL_TABLET | Freq: Two times a day (BID) | ORAL | Status: AC
  Administered 2024-09-12 – 2024-09-13 (×2): 1 mg via ORAL
  Filled 2024-09-09 (×2): qty 1

## 2024-09-09 MED ORDER — CLONIDINE HCL 0.1 MG PO TABS
0.1000 mg | ORAL_TABLET | Freq: Once | ORAL | Status: AC
  Administered 2024-09-09: 0.1 mg via ORAL
  Filled 2024-09-09: qty 1

## 2024-09-09 MED ORDER — DIPHENHYDRAMINE HCL 50 MG PO CAPS: 50.0000 mg | ORAL_CAPSULE | Freq: Three times a day (TID) | ORAL | Status: DC | PRN

## 2024-09-09 MED ORDER — THIAMINE MONONITRATE 100 MG PO TABS: 100.0000 mg | ORAL_TABLET | Freq: Every day | ORAL | Status: DC

## 2024-09-09 MED ORDER — LORAZEPAM 1 MG PO TABS: 1.0000 mg | ORAL_TABLET | Freq: Four times a day (QID) | ORAL | Status: AC | PRN

## 2024-09-09 MED ORDER — MAGNESIUM HYDROXIDE 400 MG/5ML PO SUSP: 30.0000 mL | Freq: Every day | ORAL | Status: DC | PRN

## 2024-09-09 MED ORDER — ONDANSETRON 4 MG PO TBDP: 4.0000 mg | ORAL_TABLET | Freq: Four times a day (QID) | ORAL | Status: DC | PRN

## 2024-09-09 MED ORDER — HYDROXYZINE HCL 25 MG PO TABS
25.0000 mg | ORAL_TABLET | Freq: Four times a day (QID) | ORAL | Status: DC | PRN
  Administered 2024-09-09: 25 mg via ORAL
  Filled 2024-09-09: qty 1

## 2024-09-09 MED ORDER — ONDANSETRON 4 MG PO TBDP: 4.0000 mg | ORAL_TABLET | Freq: Four times a day (QID) | ORAL | Status: AC | PRN

## 2024-09-09 MED ORDER — LORAZEPAM 1 MG PO TABS
1.0000 mg | ORAL_TABLET | Freq: Every day | ORAL | Status: AC
  Administered 2024-09-14: 1 mg via ORAL
  Filled 2024-09-09: qty 1

## 2024-09-09 MED ORDER — LORAZEPAM 1 MG PO TABS: 1.0000 mg | ORAL_TABLET | Freq: Four times a day (QID) | ORAL | Status: DC | PRN

## 2024-09-09 MED ORDER — LOPERAMIDE HCL 2 MG PO CAPS: 2.0000 mg | ORAL_CAPSULE | ORAL | Status: AC | PRN

## 2024-09-09 MED ORDER — LOPERAMIDE HCL 2 MG PO CAPS: 2.0000 mg | ORAL_CAPSULE | ORAL | Status: DC | PRN

## 2024-09-09 NOTE — ED Notes (Signed)
 Pt laying on bed at this hour. No apparent distress. RR even and unlabored. Monitored for safety.

## 2024-09-09 NOTE — ED Notes (Signed)
 Patient admitted voluntarily for alcohol  detoxification.History of alcohol  use disorder since 2019. Last drink was late last night.  skin assessment completed, envt secure. Pt given food and drink. denies depression, anxiety, suicidal or homicidal ideations. Currently alert, oriented x4 cooperative, calm, with dysphoric mood but congruent affect. Speech and thought processes normal . No signs of acute distress, psychosis, mania, or delusions observed. Pt oriented to unit, sitting in recliner at this time.

## 2024-09-09 NOTE — ED Provider Notes (Cosign Needed Addendum)
 Behavioral Health Urgent Care Medical Screening Exam  Patient Name: Timothy Estrada MRN: [id number] Date of Evaluation: 09/09/24 Chief Complaint:  Alcohol  detox Diagnosis:  Final diagnoses:  Alcohol  use disorder  Marijuana use    History of Present illness: Timothy Estrada Ihnen 53 y.o., male patient presented to [facility] as a voluntary walk in unaccompanied with complaints of wanting to detox from alcohol . MACEDONIO SCALLON, is seen face to face by this provider and chart reviewed on 09/09/24. Per chart review, pt has a PPHx of alcohol  use disorder.  Medical hx pertinent for chronic pain from pelvic fracture 3 months ago (non surgical). Pt's blood pressure elevated and seems to trend high, however has not been treated for hypertension.Pt denies headaches, dizziness and chest pain. Pt saw his PCP in August but does not have psychiatry or substance abuse treatment at this time. Pt was last admitted to [facility] 1 year ago.   On evaluation [name] reports that  he has been abusing alcohol  since about 2019 and that it became worse during the COVID pandemic which is when he stopped drinking beer and started heavily drinking liquor. Pt reports that he is currently drinking at least 1 pint of vodka daily since 2020. Pt reports his last alcoholic beverage was late last night when he finished of his pint of vodka. Pt states that he is starting to develop withdrawal symptoms including tremors and overall just not feeling good. He denies hx of seizures and delirium tremens. Pt reports longest period of sobriety was for 6 months many years ago. He reports also being treated here at [facility] for a few days about 1 year ago. Pt reports occasional THC use about twice a week and endorses daily cigarette smoking.  Pt denies symptoms of depression and anxiety and is not sure what triggered his alcohol  use. Pt reports family hx of AUD in mother and brother. He denies current suicidal/ homicidal ideations and psychotic symptoms. Pt is  hoping to transition to residential treatment facility after detox.   During evaluation [name] is sitting up in assessment room, in no acute distress.  He is alert & oriented x 4, calm, cooperative and attentive for this assessment.  His mood is dysphoric with congruent affect.  He has normal speech, and behavior.  Objectively there is no evidence of psychosis/mania or delusional thinking. Pt does not appear to be responding to internal or external stimuli.  Patient is able to converse coherently, goal directed thoughts, no distractibility, or pre-occupation.  He  denies current suicidal/self-harm/homicidal ideation, psychosis, and paranoia.  Patient answered assessment questions appropriately.      Flowsheet Row ED from 09/09/2024 in [facility] ED to Hosp-Admission (Discharged) from 06/18/2024 in [facility] ED from 02/03/2024 in [facility] at [facility]  C-SSRS RISK CATEGORY No Risk No Risk No Risk    Psychiatric Specialty Exam  Presentation  General Appearance:Appropriate for Environment  Eye Contact:Good  Speech:Clear and Coherent  Speech Volume:Normal  Handedness:Right   Mood and Affect  Mood: Dysphoric  Affect: Appropriate   Thought Process  Thought Processes: Coherent; Linear  Descriptions of Associations:Intact  Orientation:Full (Time, Place and Person)  Thought Content:WDL  Diagnosis of Schizophrenia or Schizoaffective disorder in past: No   Hallucinations:None  Ideas of Reference:None  Suicidal Thoughts:No  Homicidal Thoughts:No   Sensorium  Memory: Immediate Good; Recent Good  Judgment: Good  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  Constitutional:      Appearance: Normal appearance.  HENT:     Head: Normocephalic.     Nose: Nose normal.  Eyes:     Extraocular Movements: Extraocular movements intact.  Cardiovascular:     Rate and Rhythm: Normal rate.     Comments: Hypertension Pulmonary:     Effort: Pulmonary effort is normal.  Musculoskeletal:        General: Normal range of motion.     Cervical back: Normal range of motion.  Neurological:     General: No focal deficit present.     Mental Status: He is alert and oriented to person, place, and time.    Review of Systems  Constitutional: Negative.   HENT: Negative.    Eyes: Negative.   Respiratory: Negative.    Cardiovascular: Negative.   Gastrointestinal: Negative.   Genitourinary: Negative.   Musculoskeletal:  Positive for joint pain.  Neurological:  Positive for tremors.  Endo/Heme/Allergies: Negative.   Psychiatric/Behavioral:  Positive for substance abuse.    Blood pressure (!) 162/110, pulse 88, temperature 98.3 F (36.8 C), temperature source Oral, resp. rate 17, SpO2 97%. There is no height or weight on file to calculate BMI.  Musculoskeletal: Strength & Muscle Tone: within normal limits Gait & Station: normal Patient leans: N/A   [facility] MSE Discharge Disposition for Follow up and Recommendations: Based on my evaluation I certify that psychiatric inpatient services furnished can reasonably be expected to improve the patient's condition which I recommend transfer to an appropriate accepting facility.  Pt is recommended for Facility Based Crisis Unit for alcohol  withdrawal with plans to transition into a residential facility for ongoing treatment and maintenance. Pt reports drinking at least 1 pint of vodka since 2020 and has recently been drinking more to a point  of blacking out at times. He reports current withdrawal symptoms of overall malaise and tremors. Denies hx of seizures and DT's. Pt is wanting to go to residential treatment after detox.   Treatment Plan: - Admit to continuous assessment until [facility] bed is available.  - Initiate CIWA with Ativan  taper for alcohol  withdrawal monitoring.  - Initiate agitation protocol per policy.  - Labs, EKG and UDS ordered  Lab Orders         CBC with Differential/Platelet         Comprehensive metabolic panel         Hemoglobin A1c         Magnesium          Ethanol         Lipid panel         TSH         POCT Urine Drug Screen - (I-Screen)     - Pt BP currently high, Clonidine 0.1 mg PRN BID ordered for SBP over 160. No prior HTN treatment, despite trending high blood pressure. EKG results were normal and pt denies chest pain, headaches and dizziness. Pt states that he was told elevated BP was due to his alcohol  usage and may resolve once detox is completed. Will monitor and possibly start an antihypertensive if BP  continues to stay elevated. Nursing to recheck BP if Clonidine is given and report to provider.    Alan JAYSON Mcardle, NP 09/09/2024, 5:36 PM

## 2024-09-09 NOTE — Group Note (Signed)
 Group Topic: Social Support  Group Date: 09/09/2024 Start Time: 2000 End Time: 2100 Facilitators: Joan Plowman B  Department: [facility]  Number of Participants: 5  Group Focus: concentration, coping skills, and daily focus Treatment Modality:  Individual Therapy Interventions utilized were leisure development, patient education, and support Purpose: enhance coping skills, express feelings, and relapse prevention strategies  Name: Timothy Estrada Date of Birth: [birth year]  MR: [id number]    Level of Participation: PT WAS NOT ON UNIT Quality of Participation: NA  Interactions with others: NA Mood/Affect: NA Triggers (if applicable): NA Cognition: NA Progress: Other Response: NA Plan: patient will be encouraged to NA  Patients Problems:  Patient Active Problem List   Diagnosis Date Noted   Fall 06/19/2024   Alcohol  use disorder, severe, dependence (HCC) 06/12/2023

## 2024-09-09 NOTE — BH Assessment (Signed)
 Comprehensive Clinical Assessment (CCA) Note  09/09/2024 Timothy Estrada [id number]  DISPOSITION: Per Alan Mcardle NP pt is recommended for detox at [facility] from alcohol   The patient demonstrates the following risk factors for suicide: Chronic risk factors for suicide include: substance use disorder. Acute risk factors for suicide include: N/A. Protective factors for this patient include: hope for the future. Considering these factors, the overall suicide risk at this point appears to be low. Patient is appropriate for outpatient follow up.   Per Triage assessment:  "Timothy Estrada 52y male presents to [facility] unaccompanied, voluntarily. PT states he has been drinking heavily (liquor) since 2020. PT reports that it has gotten worse to where he has blackouts. PT states that he drinks everyday; at least a pint. PT reports when he goes without alcohol  he starts to have withdrawals. PT is here for detox from alcohol . PT denies SI, HI, AVH."  With further assessment: Pt is a 53 yo male who presented voluntarily and unaccompanied requesting detox from alcohol . Pt denied SI, HI, NSSH, AVH and paranoia. Pt reported daily use of alcohol  at about a pint daily and regular use of cannabis, usually in social situations, about once or twice a week. Pt stated that he began drinking more and more beer during the pandemic (2020) and then switched over to liquor. Pt stated that about once as month he drinks until he has a "blackout." Pt stated that when he does not drink he begins to "get shakey."Pt stated that he has not had any substance use treatment in the past.   Pt denied any hx of mental health issues, with no psychiatric hospitalizations and no OP providers. Pt stated that he lives with his sister or "here & there." Pt denied any current legal issues, access to firearms or any childhood abuse. 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 Pt state he is still recovering but is not taking any pain medications at this time. Pt stated that he is sleeping "okay" and eating "good." No symptoms of depression were present during the assessment.   Pt was calm, cooperative, polite and soft-spoken. Pt was dressed casually and neatly and seemed adequately groomed. Pt's eye contact, speech and movement were within normal limits. Pt's judgment and insight seemed good.    Chief Complaint:  Chief Complaint  Patient presents with   Alcohol  Problem   Visit Diagnosis:  Alcohol  Use d/o, Severe   CCA Screening, Triage and Referral (STR)  Patient Reported Information How did you hear about us ? No data recorded What Is the Reason for Your Visit/Call Today? Timothy Estrada 52y male presents to [facility] unaccompanied, voluntarily. PT states he has been drinking heavily (liquor) since 2020. PT reports that it has gotten worse to where he has blackouts. PT states that he drinks everyday; at least a pint. PT reports when he goes without alcohol  he starts to have withdrawals. PT is here for detox from alcohol . PT denies SI, HI, AVH.  How Long Has This Been Causing You Problems? > than 6 months  What Do You Feel Would Help You the Most Today? Alcohol  or Drug Use Treatment   Have You Recently Had Any Thoughts About Hurting Yourself? No  Are You Planning to Commit Suicide/Harm Yourself At This time? No   Flowsheet Row ED from 09/09/2024 in [facility] ED to Hosp-Admission (Discharged) from 06/18/2024  in [facility] from 02/03/2024 in [facility]  C-SSRS RISK CATEGORY No Risk No Risk No Risk    Have you Recently Had Thoughts About Hurting Someone [name]? No  Are You Planning to Harm Someone at This Time? No  Explanation: na  Have You Used Any Alcohol  or Drugs in the Past 24 Hours? Yes  How Long Ago Did You Use Drugs or Alcohol ?  yesterday What Did You Use and How Much? 1 pint of vodka, marijuana (1 blunt)   Do You Currently Have a Therapist/Psychiatrist? No  Name of Therapist/Psychiatrist:    Have You Been Recently Discharged From Any Office Practice or Programs? No  Explanation of Discharge From Practice/Program: na    CCA Screening Triage Referral Assessment Type of Contact: Face-to-Face  Telemedicine Service Delivery:   Is this Initial or Reassessment?   Date Telepsych consult ordered in [facility]:    Time Telepsych consult ordered in [facility]:    Location of Assessment: [facility] Assessment Services  Provider Location: [facility] Assessment Services   Collateral Involvement: none   Does Patient Have a Automotive engineer Guardian? No  Legal Guardian Contact Information: na  Copy of Legal Guardianship Form: -- (na)  Legal Guardian Notified of Arrival: -- (na)  Legal Guardian Notified of Pending Discharge: -- (na)  If Minor and Not Living with Parent(s), Who has Custody? adult  Is CPS involved or ever been involved? -- (none reported)  Is APS involved or ever been involved? -- (none reported)   Patient Determined To Be At Risk for Harm To Self or Others Based on Review of Patient Reported Information or Presenting Complaint? No  Method: No Plan  Availability of Means: No access or NA  Intent: Vague intent or NA  Notification Required: No need or identified person  Additional Information for Danger to Others Potential: -- (na)  Additional Comments for Danger to Others Potential: n/a  Are There Guns or Other Weapons in Your Home? No  Types of Guns/Weapons: Pt denies access to weapons  Are These Weapons Safely Secured?                            No  Who Could Verify You Are Able To Have These Secured: na  Do You Have any Outstanding Charges, Pending Court Dates, Parole/Probation? pt denied  Contacted To Inform of Risk of Harm To Self or Others: -- (na)    Does Patient Present under  Involuntary Commitment? No    [place] of Residence: [place]   Patient Currently Receiving the Following Services: Not Receiving Services   Determination of Need: Urgent (48 hours)   Options For Referral: Facility-Based Crisis     CCA Biopsychosocial Patient Reported Schizophrenia/Schizoaffective Diagnosis in Past: No   Strengths: able to ask for help   Mental Health Symptoms Depression:  None   Duration of Depressive symptoms:    Mania:  None   Anxiety:   None   Psychosis:  None   Duration of Psychotic symptoms:    Trauma:  None   Obsessions:  None   Compulsions:  None   Inattention:  N/A   Hyperactivity/Impulsivity:  N/A   Oppositional/Defiant Behaviors:  N/A   Emotional Irregularity:  None   Other Mood/Personality Symptoms:  none    Mental Status Exam Appearance and self-care  Stature:  Average   Weight:  Average weight  Clothing:  Casual; Neat/clean   Grooming:  Normal   Cosmetic use:  None   Posture/gait:  Normal   Motor activity:  Not Remarkable   Sensorium  Attention:  Normal   Concentration:  Normal   Orientation:  X5   Recall/memory:  Normal   Affect and Mood  Affect:  Appropriate   Mood:  Euthymic   Relating  Eye contact:  Normal   Facial expression:  Responsive   Attitude toward examiner:  Cooperative   Thought and Language  Speech flow: Clear and Coherent   Thought content:  Appropriate to Mood and Circumstances   Preoccupation:  None   Hallucinations:  None   Organization:  Intact   Company secretary of Knowledge:  Average   Intelligence:  Average   Abstraction:  Functional   Judgement:  Good   Reality Testing:  Adequate   Insight:  Good   Decision Making:  Normal   Social Functioning  Social Maturity:  Responsible   Social Judgement:  Normal   Stress  Stressors:  Illness (healing pelvis fracture & substance use)   Coping Ability:  Normal   Skill Deficits:  Self-control;  Self-care   Supports:  Family; Friends/Service system     Religion: Religion/Spirituality Are You A Religious Person?: Yes What is Your Religious Affiliation?: Christian How Might This Affect Treatment?: unknown  Leisure/Recreation: Leisure / Recreation Do You Have Hobbies?: Yes Leisure and Hobbies: playing the drum and mountain biking  Exercise/Diet: Exercise/Diet Do You Exercise?: Yes What Type of Exercise Do You Do?: Bike How Many Times a Week Do You Exercise?: 4-5 times a week Have You Gained or Lost A Significant Amount of Weight in the Past Six Months?: No Do You Follow a Special Diet?: No Do You Have Any Trouble Sleeping?: No   CCA Employment/Education Employment/Work Situation: Employment / Work Situation Employment Situation: Leave of absence Patient's Job has Been Impacted by Current Illness: Yes Describe how Patient's Job has Been Impacted: healing from a serious injury Has Patient ever Been in the [employer]?: No  Education: Education Is Patient Currently Attending School?: No Last Grade Completed: 8 Did You Attend College?: No Did You Have An Individualized Education Program (IIEP): No Did You Have Any Difficulty At School?: No Patient's Education Has Been Impacted by Current Illness: No   CCA Family/Childhood History Family and Relationship History: Family history Marital status: Single Does patient have children?: Yes How many children?: 1 (73 yo daughter) How is patient's relationship with their children?: good  Childhood History:  Childhood History By whom was/is the patient raised?: Both parents Did patient suffer any verbal/emotional/physical/sexual abuse as a child?: No Did patient suffer from severe childhood neglect?: No Has patient ever been sexually abused/assaulted/raped as an adolescent or adult?: No Was the patient ever a victim of a crime or a disaster?: No Witnessed domestic violence?: No Has patient been affected by domestic  violence as an adult?: No       CCA Substance Use Alcohol /Drug Use: Alcohol  / Drug Use Pain Medications: see MAR Prescriptions: see MAR Over the Counter: see MAR History of alcohol  / drug use?: Yes Longest period of sobriety (when/how long): unknown Negative Consequences of Use:  (none reported) Withdrawal Symptoms: Tremors Substance #1 Name of Substance 1: alcohol  1 - Age of First Use: unknown 1 - Amount (size/oz): minimum a pint of liquor 1 - Frequency: daily since 2020 1 - Duration: ongoing 1 - Last Use / Amount: yesterday 1 - Method  of Aquiring: purchase 1- Route of Use: oral, drink Substance #2 Name of Substance 2: cannabis 2 - Age of First Use: unknown 2 - Amount (size/oz): unknown 2 - Frequency: socially about 1-2 x week 2 - Duration: ongoing 2 - Last Use / Amount: unknown 2 - Method of Aquiring: unknown 2 - Route of Substance Use: smoke                     ASAM's:  Six Dimensions of Multidimensional Assessment  Dimension 1:  Acute Intoxication and/or Withdrawal Potential:   Dimension 1:  Description of individual's past and current experiences of substance use and withdrawal: gets shakey when he does not drinking per pt  Dimension 2:  Biomedical Conditions and Complications:   Dimension 2:  Description of patient's biomedical conditions and  complications: untreated HTN and healing fractured pelvis  Dimension 3:  Emotional, Behavioral, or Cognitive Conditions and Complications:  Dimension 3:  Description of emotional, behavioral, or cognitive conditions and complications: no psychiatric hx  Dimension 4:  Readiness to Change:     Dimension 5:  Relapse, Continued use, or Continued Problem Potential:     Dimension 6:  Recovery/Living Environment:     ASAM Severity Score: ASAM's Severity Rating Score: 8  ASAM Recommended Level of Treatment: ASAM Recommended Level of Treatment: Level III Residential Treatment   Substance use Disorder (SUD) Substance  Use Disorder (SUD)  Checklist Symptoms of Substance Use: Continued use despite having a persistent/recurrent physical/psychological problem caused/exacerbated by use, Continued use despite persistent or recurrent social, interpersonal problems, caused or exacerbated by use, Persistent desire or unsuccessful efforts to cut down or control use, Recurrent use that results in a failure to fulfill major role obligations (work, school, home), Evidence of withdrawal (Comment)  Recommendations for Services/Supports/Treatments: Recommendations for Services/Supports/Treatments Recommendations For Services/Supports/Treatments: Detox, CD-IOP Intensive Chemical Dependency Program, Residential-Level 2  Disposition Recommendation per psychiatric provider: We recommend transfer to [facility]. Per Alan Mcardle NP pt is recommended for detox at [facility] from alcohol    DSM5 Diagnoses: Patient Active Problem List   Diagnosis Date Noted   Fall 06/19/2024   Alcohol  use disorder, severe, dependence (HCC) 06/12/2023     Referrals to Alternative Service(s): Referred to Alternative Service(s):   Place:   Date:   Time:    Referred to Alternative Service(s):   Place:   Date:   Time:    Referred to Alternative Service(s):   Place:   Date:   Time:    Referred to Alternative Service(s):   Place:   Date:   Time:     Ballard Budney T, Counselor

## 2024-09-09 NOTE — Discharge Instructions (Signed)
Pt transferred to [facility]. 

## 2024-09-10 DIAGNOSIS — F1020 Alcohol dependence, uncomplicated: Secondary | ICD-10-CM | POA: Diagnosis not present

## 2024-09-10 MED ORDER — NICOTINE 21 MG/24HR TD PT24
21.0000 mg | MEDICATED_PATCH | Freq: Every day | TRANSDERMAL | Status: DC
  Administered 2024-09-10 – 2024-09-14 (×4): 21 mg via TRANSDERMAL
  Filled 2024-09-10 (×6): qty 1

## 2024-09-10 MED ORDER — CLONIDINE HCL 0.1 MG PO TABS
0.1000 mg | ORAL_TABLET | Freq: Three times a day (TID) | ORAL | Status: DC | PRN
  Administered 2024-09-10 – 2024-09-15 (×3): 0.1 mg via ORAL
  Filled 2024-09-10 (×3): qty 1

## 2024-09-10 MED ORDER — TRAZODONE HCL 50 MG PO TABS
50.0000 mg | ORAL_TABLET | Freq: Every evening | ORAL | Status: DC | PRN
  Administered 2024-09-11 – 2024-09-14 (×4): 50 mg via ORAL
  Filled 2024-09-10 (×4): qty 1

## 2024-09-10 MED ORDER — IBUPROFEN 600 MG PO TABS
600.0000 mg | ORAL_TABLET | Freq: Three times a day (TID) | ORAL | Status: DC | PRN
  Administered 2024-09-11 – 2024-09-14 (×3): 600 mg via ORAL
  Filled 2024-09-10 (×3): qty 1

## 2024-09-10 MED ORDER — CLONIDINE HCL 0.1 MG PO TABS: 0.1000 mg | ORAL_TABLET | Freq: Two times a day (BID) | ORAL | Status: DC | PRN

## 2024-09-10 NOTE — ED Notes (Signed)
 Patient is in the bedroom asleep. NAD

## 2024-09-10 NOTE — ED Notes (Signed)
 Paitent had lunch.

## 2024-09-10 NOTE — ED Provider Notes (Addendum)
 Facility Based Crisis Admission H&P  Date: 09/10/24 Patient Name: Timothy Estrada MRN: [id number] Chief Complaint: alcohol  use   Diagnoses:  Final diagnoses:  Alcohol  use disorder, severe, dependence (HCC)    HPI: Timothy Estrada is a 53 year old male patient with a past psychiatric history significant for alcohol  use disorder severe dependence who was admitted to the [facility] on 09/09/2024 for alcohol  use. Patient reports drinking a pint of vodka daily since 2020 and states that he last consumed alcohol  late last night on 09/08/2024. BAL negative on arrival. UDS positive for marijuana.  On evaluation, patient is alert and oriented x 4. Thought process is linear and goal oriented. Thought content is negative for suicidal thoughts, homicidal thoughts, auditory or visual hallucinations and paranoia. Objectively, no signs of acute psychosis. His mood is euthymic and affect is congruent.   Patient denies depressive or anxiety symptoms. No sadness, hopelessness, worthlessness, decreased energy, decreased motivation, poor appetite, irritability, crying spells, anhedonia or excessive worrying. PHQ-9 score 0.  He reports drinking alcohol  for the past 18 years. He reports drinking more than usual. He reports drinking alcohol  almost every day since the pandemic, on average a pint of liquor daily. BAL negative on arrival. He denies alcohol  withdrawal symptoms. He reports having mild hand tremors and anxiety yesterday. He denies a history of alcohol  withdrawal seizures or delirium tremens. He reports occasional marijuana use. He denies using other illicit drugs. UDS positive for marijuana. He reports past substance abuse treatment here at the [facility]. He denies history of substance abuse rehabilitation.   He denies a psychiatric history other than alcohol  use disorder. He denies outpatient psychiatry or counseling. He denies taking prescribed medication. He denies past inpatient  psychiatric hospitalizations. He denies a history of past suicide attempts.   He states that he lives between his parents and his sister house. 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 He denies legal issues.   Patient hypertensive. He denies history of HTN. He denies taking antihypertensive medications. He denies chest pain, headaches, SOB, weakness in extremities or periods of confusion. He states that his blood pressure has been elevated since he fractured his pelvis three months ago. He reports intermittent pelvic pain and states that ibuprofen  and tylenol  helps reduce pain.   PHQ 2-9:   Flowsheet Row ED from 09/09/2024 in [facility] Most recent reading at 09/09/2024 11:58 PM ED from 09/09/2024 in [facility] Most recent reading at 09/09/2024  6:52 PM ED to Hosp-Admission (Discharged) from 06/18/2024 in [place] [facility] Most recent reading at 06/18/2024  8:00 PM  C-SSRS RISK CATEGORY No Risk No Risk No Risk    Screenings    Flowsheet Row Most Recent Value  CIWA-Ar Total 0    Total Time spent with patient: 30 minutes  Musculoskeletal  Strength & Muscle Tone: within normal limits Gait & Station: normal Patient leans: N/A  Psychiatric Specialty Exam  Presentation General Appearance:  Appropriate for Environment  Eye Contact: Good  Speech: Clear and Coherent  Speech Volume: Normal  Handedness: Right   Mood and Affect  Mood: Dysphoric  Affect: Appropriate   Thought Process  Thought Processes: Coherent; Linear  Descriptions of Associations:Intact  Orientation:Full (Time, Place and Person)  Thought Content:WDL  Diagnosis of Schizophrenia or Schizoaffective disorder in past: No   Hallucinations:Hallucinations: None  Ideas of Reference:None  Suicidal Thoughts:Suicidal Thoughts: No  Homicidal Thoughts:Homicidal  Thoughts: No   Sensorium   Memory: Immediate Good; Recent Good  Judgment: Good  Insight: Good   Executive Functions  Concentration: Good  Attention Span: Good  Recall: Good  Fund of Knowledge: Good  Language: Good   Psychomotor Activity  Psychomotor Activity: Psychomotor Activity: Tremor   Assets  Assets: Desire for Improvement; Physical Health; Social Support; Manufacturing systems engineer; Resilience; Talents/Skills   Sleep  Sleep: Fair   Nutritional Assessment (For OBS and FBC admissions only) Has the patient had a weight loss or gain of 10 pounds or more in the last 3 months?: No Has the patient had a decrease in food intake/or appetite?: No Does the patient have dental problems?: No Does the patient have eating habits or behaviors that may be indicators of an eating disorder including binging or inducing vomiting?: No Has the patient recently lost weight without trying?: 0 Has the patient been eating poorly because of a decreased appetite?: 0 Malnutrition Screening Tool Score: 0    Physical Exam Cardiovascular:     Rate and Rhythm: Normal rate.  Pulmonary:     Effort: Pulmonary effort is normal.  Musculoskeletal:        General: Normal range of motion.     Cervical back: Normal range of motion.  Neurological:     Mental Status: He is alert and oriented to person, place, and time.    Review of Systems  Constitutional: Negative.   HENT: Negative.    Eyes: Negative.   Respiratory: Negative.    Cardiovascular: Negative.   Gastrointestinal: Negative.   Genitourinary: Negative.   Neurological: Negative.     Blood pressure (!) 126/94, pulse 62, temperature 98 F (36.7 C), temperature source Oral, resp. rate 17, SpO2 98%. There is no height or weight on file to calculate BMI.  Past Psychiatric History: History of alcohol  use disorder.  Is the patient at risk to self? No  Has the patient been a risk to self in the past 6 months? No .    Has the patient been a risk to self  within the distant past? No   Is the patient a risk to others? No   Has the patient been a risk to others in the past 6 months? No   Has the patient been a risk to others within the distant past? No   Past Medical History:Pelvis fracture three months ago.  Family History: No reported history  Social History: Patient reports that he lives between his parents and sister house. 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 He reports alcohol  use. He reports occasional marijuana use.  Last Labs:  Admission on 09/09/2024, Discharged on 09/09/2024  Component Date Value Ref Range Status   WBC 09/09/2024 8.9  4.0 - 10.5 K/uL Final   RBC 09/09/2024 4.89  4.22 - 5.81 MIL/uL Final   Hemoglobin 09/09/2024 15.1  13.0 - 17.0 g/dL Final   HCT 89/90/7974 45.7  39.0 - 52.0 % Final   MCV 09/09/2024 93.5  80.0 - 100.0 fL Final   MCH 09/09/2024 30.9  26.0 - 34.0 pg Final   MCHC 09/09/2024 33.0  30.0 - 36.0 g/dL Final   RDW 89/90/7974 13.2  11.5 - 15.5 % Final   Platelets 09/09/2024 356  150 - 400 K/uL Final   nRBC 09/09/2024 0.0  0.0 - 0.2 % Final   Neutrophils Relative % 09/09/2024 68  % Final   Neutro Abs 09/09/2024 6.2  1.7 - 7.7 K/uL Final  Lymphocytes Relative 09/09/2024 20  % Final   Lymphs Abs 09/09/2024 1.7  0.7 - 4.0 K/uL Final   Monocytes Relative 09/09/2024 10  % Final   Monocytes Absolute 09/09/2024 0.9  0.1 - 1.0 K/uL Final   Eosinophils Relative 09/09/2024 1  % Final   Eosinophils Absolute 09/09/2024 0.1  0.0 - 0.5 K/uL Final   Basophils Relative 09/09/2024 1  % Final   Basophils Absolute 09/09/2024 0.1  0.0 - 0.1 K/uL Final   Immature Granulocytes 09/09/2024 0  % Final   Abs Immature Granulocytes 09/09/2024 0.02  0.00 - 0.07 K/uL Final   Performed at [facility] Lab, [address], [place], [place] [address]   Sodium 09/09/2024 136  135 - 145 mmol/L Final   Potassium 09/09/2024 4.9  3.5 - 5.1 mmol/L Final   Chloride 09/09/2024 98  98 - 111 mmol/L Final   CO2  09/09/2024 27  22 - 32 mmol/L Final   Glucose, Bld 09/09/2024 91  70 - 99 mg/dL Final   Glucose reference range applies only to samples taken after fasting for at least 8 hours.   BUN 09/09/2024 11  6 - 20 mg/dL Final   Creatinine, Ser 09/09/2024 0.93  0.61 - 1.24 mg/dL Final   Calcium 89/90/7974 9.7  8.9 - 10.3 mg/dL Final   Total Protein 89/90/7974 7.7  6.5 - 8.1 g/dL Final   Albumin 89/90/7974 4.4  3.5 - 5.0 g/dL Final   AST 89/90/7974 28  15 - 41 U/L Final   ALT 09/09/2024 28  0 - 44 U/L Final   Alkaline Phosphatase 09/09/2024 105  38 - 126 U/L Final   Total Bilirubin 09/09/2024 1.4 (H)  0.0 - 1.2 mg/dL Final   GFR, Estimated 09/09/2024 >60  >60 mL/min Final   Comment: (NOTE) Calculated using the CKD-EPI Creatinine Equation (2021)    Anion gap 09/09/2024 11  5 - 15 Final   Performed at [facility], [address] [place], [place] [address]   Hgb A1c MFr Bld 09/09/2024 4.7 (L)  4.8 - 5.6 % Final   Comment: (NOTE) Diagnosis of Diabetes The following HbA1c ranges recommended by the American Diabetes Association (ADA) may be used as an aid in the diagnosis of diabetes mellitus.  Hemoglobin             Suggested A1C NGSP%              Diagnosis  <5.7                   Non Diabetic  5.7-6.4                Pre-Diabetic  >6.4                   Diabetic  <7.0                   Glycemic control for                       adults with diabetes.     Mean Plasma Glucose 09/09/2024 88.19  mg/dL Final   Performed at [facility] Lab, [address] [place], [place] [address]   Magnesium  09/09/2024 2.3  1.7 - 2.4 mg/dL Final   Performed at [facility], [address], [place], [place] [address]   Alcohol , Ethyl (B) 09/09/2024 <15  <15 mg/dL Final   Comment: (NOTE) For medical  purposes only. Performed at [facility] Lab, [address] [place], [place] [address]    Cholesterol 09/09/2024 237 (H)  0 - 200 mg/dL Final   Triglycerides 89/90/7974 99  <150 mg/dL  Final   HDL 89/90/7974 107  >40 mg/dL Final   Total CHOL/HDL Ratio 09/09/2024 2.2  RATIO Final   VLDL 09/09/2024 20  0 - 40 mg/dL Final   LDL Cholesterol 09/09/2024 110 (H)  0 - 99 mg/dL Final   Comment:        Total Cholesterol/HDL:CHD Risk Coronary Heart Disease Risk Table                     Men   Women  1/2 Average Risk   3.4   3.3  Average Risk       5.0   4.4  2 X Average Risk   9.6   7.1  3 X Average Risk  23.4   11.0        Use the calculated Patient Ratio above and the CHD Risk Table to determine the patient's CHD Risk.        ATP III CLASSIFICATION (LDL):  <100     mg/dL   Optimal  899-870  mg/dL   Near or Above                    Optimal  130-159  mg/dL   Borderline  839-810  mg/dL   High  >809     mg/dL   Very High Performed at [facility] Lab, [address] [place], [place] [address]    POC Amphetamine UR 09/09/2024 None Detected  NONE DETECTED (Cut Off Level 1000 ng/mL) Final   POC Secobarbital (BAR) 09/09/2024 None Detected  NONE DETECTED (Cut Off Level 300 ng/mL) Final   POC Buprenorphine (BUP) 09/09/2024 None Detected  NONE DETECTED (Cut Off Level 10 ng/mL) Final   POC Oxazepam (BZO) 09/09/2024 None Detected  NONE DETECTED (Cut Off Level 300 ng/mL) Final   POC Cocaine UR 09/09/2024 None Detected  NONE DETECTED (Cut Off Level 300 ng/mL) Final   POC Methamphetamine UR 09/09/2024 None Detected  NONE DETECTED (Cut Off Level 1000 ng/mL) Final   POC Morphine  09/09/2024 None Detected  NONE DETECTED (Cut Off Level 300 ng/mL) Final   POC Methadone UR 09/09/2024 None Detected  NONE DETECTED (Cut Off Level 300 ng/mL) Final   POC Oxycodone  UR 09/09/2024 None Detected  NONE DETECTED (Cut Off Level 100 ng/mL) Final   POC Marijuana UR 09/09/2024 Positive (A)  NONE DETECTED (Cut Off Level 50 ng/mL) Final   TSH 09/09/2024 2.609  0.350 - 4.500 uIU/mL Final   Comment: Performed by a 3rd Generation assay with a functional sensitivity of <=0.01 uIU/mL. Performed at [facility], [address], [place], [place] [address]   Admission on 06/18/2024, Discharged on 06/21/2024  Component Date Value Ref Range Status   WBC 06/18/2024 11.5 (H)  4.0 - 10.5 K/uL Final   RBC 06/18/2024 4.48  4.22 - 5.81 MIL/uL Final   Hemoglobin 06/18/2024 14.1  13.0 - 17.0 g/dL Final   HCT 92/81/7974 41.9  39.0 - 52.0 % Final   MCV 06/18/2024 93.5  80.0 - 100.0 fL Final   MCH 06/18/2024 31.5  26.0 - 34.0 pg Final   MCHC 06/18/2024 33.7  30.0 - 36.0 g/dL Final   RDW 92/81/7974 13.1  11.5 - 15.5 % Final   Platelets 06/18/2024 256  150 - 400 K/uL Final   nRBC 06/18/2024 0.0  0.0 - 0.2 % Final   Neutrophils Relative % 06/18/2024 64  % Final   Neutro Abs 06/18/2024 7.3  1.7 - 7.7 K/uL Final   Lymphocytes Relative 06/18/2024 25  % Final   Lymphs Abs 06/18/2024 2.9  0.7 - 4.0 K/uL Final   Monocytes Relative 06/18/2024 7  % Final   Monocytes Absolute 06/18/2024 0.8  0.1 - 1.0 K/uL Final   Eosinophils Relative 06/18/2024 1  % Final   Eosinophils Absolute 06/18/2024 0.2  0.0 - 0.5 K/uL Final   Basophils Relative 06/18/2024 1  % Final   Basophils Absolute 06/18/2024 0.1  0.0 - 0.1 K/uL Final   Immature Granulocytes 06/18/2024 2  % Final   Abs Immature Granulocytes 06/18/2024 0.22 (H)  0.00 - 0.07 K/uL Final   Performed at [facility] Lab, [address], [place], [place] [address]   Sodium 06/18/2024 143  135 - 145 mmol/L Final   Potassium 06/18/2024 3.5  3.5 - 5.1 mmol/L Final   Chloride 06/18/2024 107  98 - 111 mmol/L Final   CO2 06/18/2024 23  22 - 32 mmol/L Final   Glucose, Bld 06/18/2024 95  70 - 99 mg/dL Final   Glucose reference range applies only to samples taken after fasting for at least 8 hours.   BUN 06/18/2024 9  6 - 20 mg/dL Final   Creatinine, Ser 06/18/2024 0.86  0.61 - 1.24 mg/dL Final   Calcium 92/81/7974 8.6 (L)  8.9 - 10.3 mg/dL Final   Total Protein 92/81/7974 6.4 (L)  6.5 - 8.1 g/dL Final   Albumin 92/81/7974 3.9  3.5 - 5.0 g/dL Final   AST 92/81/7974 99  (H)  15 - 41 U/L Final   ALT 06/18/2024 82 (H)  0 - 44 U/L Final   Alkaline Phosphatase 06/18/2024 62  38 - 126 U/L Final   Total Bilirubin 06/18/2024 0.9  0.0 - 1.2 mg/dL Final   GFR, Estimated 06/18/2024 >60  >60 mL/min Final   Comment: (NOTE) Calculated using the CKD-EPI Creatinine Equation (2021)    Anion gap 06/18/2024 13  5 - 15 Final   Performed at [facility], [address], [place], [place] [address]   Sodium 06/18/2024 143  135 - 145 mmol/L Final   Potassium 06/18/2024 3.4 (L)  3.5 - 5.1 mmol/L Final   Chloride 06/18/2024 107  98 - 111 mmol/L Final   BUN 06/18/2024 10  6 - 20 mg/dL Final   Creatinine, Ser 06/18/2024 1.30 (H)  0.61 - 1.24 mg/dL Final   Glucose, Bld 92/81/7974 92  70 - 99 mg/dL Final   Glucose reference range applies only to samples taken after fasting for at least 8 hours.   Calcium, Ion 06/18/2024 1.03 (L)  1.15 - 1.40 mmol/L Final   TCO2 06/18/2024 23  22 - 32 mmol/L Final   Hemoglobin 06/18/2024 14.6  13.0 - 17.0 g/dL Final   HCT 92/81/7974 43.0  39.0 - 52.0 % Final   Lactic Acid, Venous 06/18/2024 2.3 (HH)  0.5 - 1.9 mmol/L Final   Comment 06/18/2024 NOTIFIED PHYSICIAN   Final   Alcohol , Ethyl (B) 06/18/2024 283 (H)  <15 mg/dL Final   Comment: (NOTE) For medical purposes only. Performed at [facility] Lab, [address], [place], [place] [address]    HIV Screen 4th Generation wRfx 06/19/2024 Non Reactive  Non Reactive Final   Performed at [facility]  Lab, [address], [place], [place] [address]   WBC 06/19/2024 9.6  4.0 - 10.5 K/uL Final   RBC 06/19/2024 3.81 (L)  4.22 - 5.81 MIL/uL Final   Hemoglobin 06/19/2024 12.2 (L)  13.0 - 17.0 g/dL Final   HCT 92/80/7974 36.1 (L)  39.0 - 52.0 % Final   MCV 06/19/2024 94.8  80.0 - 100.0 fL Final   MCH 06/19/2024 32.0  26.0 - 34.0 pg Final   MCHC 06/19/2024 33.8  30.0 - 36.0 g/dL Final   RDW 92/80/7974 13.7  11.5 - 15.5 % Final   Platelets 06/19/2024 190  150 - 400 K/uL Final   nRBC  06/19/2024 0.0  0.0 - 0.2 % Final   Performed at [facility] Lab, [address] [place], [place] [address]   Sodium 06/19/2024 142  135 - 145 mmol/L Final   Potassium 06/19/2024 3.8  3.5 - 5.1 mmol/L Final   Chloride 06/19/2024 106  98 - 111 mmol/L Final   CO2 06/19/2024 26  22 - 32 mmol/L Final   Glucose, Bld 06/19/2024 86  70 - 99 mg/dL Final   Glucose reference range applies only to samples taken after fasting for at least 8 hours.   BUN 06/19/2024 8  6 - 20 mg/dL Final   Creatinine, Ser 06/19/2024 0.98  0.61 - 1.24 mg/dL Final   Calcium 92/80/7974 8.5 (L)  8.9 - 10.3 mg/dL Final   GFR, Estimated 06/19/2024 >60  >60 mL/min Final   Comment: (NOTE) Calculated using the CKD-EPI Creatinine Equation (2021)    Anion gap 06/19/2024 10  5 - 15 Final   Performed at [facility] Lab, [address], [place], [place] [address]   WBC 06/21/2024 8.3  4.0 - 10.5 K/uL Final   RBC 06/21/2024 3.92 (L)  4.22 - 5.81 MIL/uL Final   Hemoglobin 06/21/2024 12.2 (L)  13.0 - 17.0 g/dL Final   HCT 92/78/7974 35.6 (L)  39.0 - 52.0 % Final   MCV 06/21/2024 90.8  80.0 - 100.0 fL Final   MCH 06/21/2024 31.1  26.0 - 34.0 pg Final   MCHC 06/21/2024 34.3  30.0 - 36.0 g/dL Final   RDW 92/78/7974 12.8  11.5 - 15.5 % Final   Platelets 06/21/2024 165  150 - 400 K/uL Final   nRBC 06/21/2024 0.0  0.0 - 0.2 % Final   Performed at [facility], [address], [place], [place] [address]   Sodium 06/21/2024 135  135 - 145 mmol/L Final   Potassium 06/21/2024 3.4 (L)  3.5 - 5.1 mmol/L Final   Chloride 06/21/2024 101  98 - 111 mmol/L Final   CO2 06/21/2024 23  22 - 32 mmol/L Final   Glucose, Bld 06/21/2024 98  70 - 99 mg/dL Final   Glucose reference range applies only to samples taken after fasting for at least 8 hours.   BUN 06/21/2024 8  6 - 20 mg/dL Final   Creatinine, Ser 06/21/2024 0.85  0.61 - 1.24 mg/dL Final   Calcium 92/78/7974 8.5 (L)  8.9 - 10.3 mg/dL Final   GFR, Estimated 06/21/2024 >60  >60  mL/min Final   Comment: (NOTE) Calculated using the CKD-EPI Creatinine Equation (2021)    Anion gap 06/21/2024 11  5 - 15 Final   Performed at [facility] Lab, [address] [place], [place] [address]   Magnesium  06/21/2024 2.0  1.7 - 2.4 mg/dL Final   Performed at [facility] Lab, [address], [place], [place] [address]    Allergies: Patient has no known allergies.  Medications:  Facility Ordered Medications  Medication   [COMPLETED] cloNIDine (CATAPRES) tablet 0.1 mg   [COMPLETED] thiamine  (VITAMIN B1) injection 100 mg   cloNIDine (CATAPRES) tablet 0.1 mg   acetaminophen  (TYLENOL ) tablet 650 mg   alum & mag hydroxide-simeth (MAALOX/MYLANTA) 200-200-20 MG/5ML suspension 30 mL   magnesium  hydroxide (MILK OF MAGNESIA) suspension 30 mL   haloperidol (HALDOL) tablet 5 mg   And   diphenhydrAMINE (BENADRYL) capsule 50 mg   haloperidol lactate (HALDOL) injection 5 mg   And   diphenhydrAMINE (BENADRYL) injection 50 mg   And   LORazepam  (ATIVAN ) injection 2 mg   haloperidol lactate (HALDOL) injection 10 mg   And   diphenhydrAMINE (BENADRYL) injection 50 mg   And   LORazepam  (ATIVAN ) injection 2 mg   hydrOXYzine  (ATARAX ) tablet 25 mg   thiamine  (VITAMIN B1) tablet 100 mg   multivitamin with minerals tablet 1 tablet   LORazepam  (ATIVAN ) tablet 1 mg   hydrOXYzine  (ATARAX ) tablet 25 mg   loperamide  (IMODIUM ) capsule 2-4 mg   ondansetron  (ZOFRAN -ODT) disintegrating tablet 4 mg   LORazepam  (ATIVAN ) tablet 1 mg   Followed by   NOREEN ON 09/11/2024] LORazepam  (ATIVAN ) tablet 1 mg   Followed by   NOREEN ON 09/12/2024] LORazepam  (ATIVAN ) tablet 1 mg   Followed by   NOREEN ON 09/14/2024] LORazepam  (ATIVAN ) tablet 1 mg   nicotine  (NICODERM CQ  - dosed in mg/24 hours) patch 21 mg   PTA Medications  Medication Sig   acetaminophen  (TYLENOL ) 500 MG tablet Take 2 tablets (1,000 mg total) by mouth every 6 (six) hours as needed. (Patient taking differently: Take 1,000-1,500 mg by  mouth every 6 (six) hours as needed (For pain).)    Long Term Goals: Improvement in symptoms so as ready for discharge  Short Term Goals: Patient will verbalize feelings in meetings with treatment team members., Patient will attend at least of 50% of the groups daily., Pt will complete the PHQ9 on admission, day 3 and discharge., and Patient will take medications as prescribed daily.  Medical Decision Making  ISAISH ALEMU is a 53 year old male patient with a past psychiatric history significant for alcohol  use disorder severe dependence who was admitted to the [facility] on 09/09/2024 for alcohol  use. Patient reports drinking a pint of vodka daily since 2020 and states that he last consumed alcohol  late last night on 09/08/2024. BAL negative on arrival. UDS positive for marijuana.  Labs  UDS positive for marijuana  BAL negative   Medication regimen  Continue Ativan  taper 1 mg, ends on 10/14 Continue clonidine 0.1 mg po as needed for elevated blood pressure Add ibuprofen  600 mg TID prn for pain   Disposition anticipated discharge 10/14. Patient interested in CD IOP. However, facility does not accept patient's insurance. Care management team, working with patient to establish outpatient services for substance abuse. Will provide patient with a handout on [facility] an online resource for AUD   Recommendations  Based on my evaluation the patient does not appear to have an emergency medical condition.  Teresa Wyline CROME, NP 09/10/24  9:39 AM

## 2024-09-10 NOTE — ED Notes (Signed)
 Paitent attended group

## 2024-09-10 NOTE — Care Management (Signed)
 [facility] Care Management  Timothy Estrada 53 y/o male presented with history of alcohol  abuse. Patient identified no SI/MH concerns. Patient is requesting in patient services 30 days or more. Patient wants to stay in the area.    Writer will refer patient to [facility] and [facility]   Patient lives at [address] [place]

## 2024-09-10 NOTE — ED Notes (Addendum)
 Pt generally pleasant, says he feels 'good. Pt c/o 6/10 back pain agreed to tylenol , would also like a nicotine  patch. Pt ate breakfast. Medications reviewed, questions denied. Pt denies si hi and avh, verbal contract for safety provided.

## 2024-09-10 NOTE — Group Note (Signed)
 Group Topic: Wellness  Group Date: 09/10/2024 Start Time: 1200 End Time: 1230 Facilitators: Daved Tinnie HERO, RN  Department: [facility]  Number of Participants: 8  Group Focus: nursing group Treatment Modality:  Psychoeducation Interventions utilized were patient education Purpose: increase insight  Name: KHALIN ROYCE Date of Birth: [birth year]  MR: [id number]    Level of Participation: moderate Quality of Participation: attentive Interactions with others: gave feedback Mood/Affect: appropriate Triggers (if applicable): n/a Cognition: coherent/clear Progress: Gaining insight Response: RN discussed medications with pt, pt expressed understanding  Plan: patient will be encouraged to attend future RN education groups   Patients Problems:  Patient Active Problem List   Diagnosis Date Noted   Alcohol  use disorder 09/09/2024   Fall 06/19/2024   Alcohol  use disorder, severe, dependence (HCC) 06/12/2023

## 2024-09-10 NOTE — ED Notes (Signed)
 Paitent had breakfast.

## 2024-09-10 NOTE — ED Notes (Signed)
 Pt ate lunch. Pt continues to be pleasant and cooperative. Pt reports that prn tylenol  administered earlier today was effective in decreasing pain. Pt watching TV in dayroom, no signs of distress.

## 2024-09-10 NOTE — Group Note (Unsigned)
 Group Topic: Recovery Basics  Group Date: 09/10/2024 Start Time: 2000 End Time: 2100 Facilitators: Anice Benton LABOR, NT  Department: [facility]  Number of Participants: 6  Group Focus: abuse issues and self-awareness (AA Group) Treatment Modality:  Patient-Centered Therapy Interventions utilized were group exercise Purpose: relapse prevention strategies(AA Group)   Name: Timothy Estrada Date of Birth: 07-27-1971  MR: [id number]    Level of Participation: {THERAPIES; PSYCH GROUP PARTICIPATION OZCZO:76008} Quality of Participation: {THERAPIES; PSYCH QUALITY OF PARTICIPATION:23992} Interactions with others: {THERAPIES; PSYCH INTERACTIONS:23993} Mood/Affect: {THERAPIES; PSYCH MOOD/AFFECT:23994} Triggers (if applicable): *** Cognition: {THERAPIES; PSYCH COGNITION:23995} Progress: {THERAPIES; PSYCH PROGRESS:23997} Response: *** Plan: {THERAPIES; PSYCH EOJW:76003}  Patients Problems:  Patient Active Problem List   Diagnosis Date Noted   Alcohol  use disorder 09/09/2024   Fall 06/19/2024   Alcohol  use disorder, severe, dependence (HCC) 06/12/2023

## 2024-09-10 NOTE — ED Notes (Signed)
 Pt ate dinner, continues to present as pleasant and gets along with peer. Pt currently chatting and watching TV in dayroom. No complaints or concerns reported to RN.

## 2024-09-10 NOTE — Care Management (Addendum)
 [facility] Care Management  Patient was declined at [facility], stated that his insurance only covers for OPT services   Writer reached out to [facility]. Patient currently under review  Writer will follow-up   3:00 pm... Writer will provide patient with listing of OPT providers

## 2024-09-10 NOTE — Group Note (Signed)
 Group Topic: Relapse and Recovery  Group Date: 09/10/2024 Start Time: 2000 End Time: 2100 Facilitators: Anice Benton LABOR, NT  Department: [facility]  Number of Participants: 6  Group Focus: abuse issues, relapse prevention, and self-awareness (AA Group) Treatment Modality:  Patient-Centered Therapy Interventions utilized were group exercise Purpose: relapse prevention strategies (AA Group)  Name: Timothy Estrada Date of Birth: 1971-09-10  MR: [id number]    Level of Participation: active Quality of Participation: attentive and cooperative Interactions with others: gave feedback Mood/Affect: appropriate and positive Triggers (if applicable): N/A Cognition: coherent/clear Progress: Significant Response: Good Plan: follow-up needed  Patients Problems:  Patient Active Problem List   Diagnosis Date Noted   Alcohol  use disorder 09/09/2024   Fall 06/19/2024   Alcohol  use disorder, severe, dependence (HCC) 06/12/2023

## 2024-09-10 NOTE — Group Note (Signed)
 Group Topic: Emotional Regulation  Group Date: 09/10/2024 Start Time: 1245 End Time: 1315 Facilitators: Veverly Oddis BRAVO, NT  Department: [facility]  Number of Participants: 4  Group Focus: anger management Treatment Modality:  Individual Therapy Interventions utilized were problem solving Purpose: express feelings  Name: Timothy Estrada Date of Birth: February 04, 1971  MR: [id number]    Level of Participation: active Quality of Participation: offered feedback Interactions with others: gave feedback Mood/Affect: positive Triggers (if applicable): none Cognition: coherent/clear Progress: Moderate Response: I want to start excersing again but I often listen to music or play the drums with my friends. Plan: patient will be encouraged to make an exercise plan and continue to play the drums.  Patients Problems:  Patient Active Problem List   Diagnosis Date Noted   Alcohol  use disorder 09/09/2024   Fall 06/19/2024   Alcohol  use disorder, severe, dependence (HCC) 06/12/2023

## 2024-09-11 DIAGNOSIS — F411 Generalized anxiety disorder: Secondary | ICD-10-CM | POA: Diagnosis not present

## 2024-09-11 DIAGNOSIS — F1020 Alcohol dependence, uncomplicated: Secondary | ICD-10-CM | POA: Diagnosis not present

## 2024-09-11 MED ORDER — NICOTINE POLACRILEX 2 MG MT GUM
2.0000 mg | CHEWING_GUM | OROMUCOSAL | Status: DC | PRN
  Administered 2024-09-12 – 2024-09-14 (×5): 2 mg via ORAL
  Filled 2024-09-11 (×4): qty 1

## 2024-09-11 MED ORDER — GABAPENTIN 100 MG PO CAPS
100.0000 mg | ORAL_CAPSULE | Freq: Three times a day (TID) | ORAL | Status: DC
  Administered 2024-09-11 – 2024-09-15 (×12): 100 mg via ORAL
  Filled 2024-09-11 (×12): qty 1

## 2024-09-11 NOTE — ED Provider Notes (Signed)
 Behavioral Health Progress Note   Date: 09/11/24 Patient Name: Timothy Estrada MRN: [id number] Chief Complaint: alcohol  use   Diagnoses:  Final diagnoses:  Alcohol  use disorder, severe, dependence (HCC)  GAD (generalized anxiety disorder)    HPI: Timothy Estrada is a 53 year old male patient with a past psychiatric history significant for alcohol  use disorder severe dependence who was admitted to the [facility] on 09/09/2024 for alcohol  use. Patient reports drinking a pint of vodka daily since 2020 and states that he last consumed alcohol  late last night on 09/08/2024. BAL negative on arrival. UDS positive for marijuana.  Patient assessment, 09/11/2024: On assessment today, patient reports restlessness, overly worrying, reports pain in his tailbone, due to recurrent falls at home related to alcohol  intoxication.  He reports craving alcohol , and Clinical research associate educated him on naltrexone  for alcohol  cravings, to which he is receptive.  Benefits, rationales, and possible side effects of this medication educated to patient, and he is receptive to trials.  We will start patient at 25 mg daily and increase as he tolerated.  Writer also educated on gabapentin , for GAD and alcohol  use disorder, to which she was also receptive, and we will start him at 100 mg 3 times daily.  Educated on benefits, rationales and possible side effects as well.  He reports that he is tolerating the Ativan  taper, and that this medication is helping with his withdrawal symptoms.  He denies any concerns related to this medication at this time. Projected date for end of taper is 10/14, and pt states that he is interested in at least a 30 day rehab so as to sustain his sobriety after discharge. He states that he is working with CSW to ensure that he gets this goal accomplished.   Mood today is anxious, he denies feelings of depression, denies depressive symptoms. His attention to personal hygiene and grooming is  fair, eye contact is good, speech is clear & coherent. Thought contents are organized and logical, and pt currently denies SI/HI/AVH or paranoia. There is no evidence of delusional thoughts.    Labs reviewed: Orders placed for the AM for Vitamins B12 and D. Qtc WNL.   Medications adjustments for today: Ordered Gabapentin  100 mg TID, and will order Vitamin D pending a level,  Trazodone  50 mg nightly porn for sleep.  PHQ 2-9:   Flowsheet Row ED from 09/09/2024 in [facility] Most recent reading at 09/09/2024 11:58 PM ED from 09/09/2024 in [facility] Most recent reading at 09/09/2024  6:52 PM ED to Hosp-Admission (Discharged) from 06/18/2024 in  [facility] Most recent reading at 06/18/2024  8:00 PM  C-SSRS RISK CATEGORY No Risk No Risk No Risk    Screenings    Flowsheet Row Most Recent Value  CIWA-Ar Total 0    Total Time spent with patient: 30 minutes  Musculoskeletal  Strength & Muscle Tone: within normal limits Gait & Station: normal Patient leans: N/A  Psychiatric Specialty Exam  Presentation General Appearance:  Casual; Fairly Groomed  Eye Contact: Fair  Speech: Clear and Coherent  Speech Volume: Normal  Handedness: Right   Mood and Affect  Mood: Anxious  Affect: Congruent   Thought Process  Thought Processes: Coherent  Descriptions of Associations:Intact  Orientation:Full (Time, Place and Person)  Thought Content:Logical  Diagnosis of Schizophrenia or Schizoaffective disorder in past: No   Hallucinations:Hallucinations: None  Ideas of Reference:None  Suicidal Thoughts:Suicidal Thoughts: No  Homicidal Thoughts:Homicidal  Thoughts: No   Sensorium  Memory: Immediate Fair  Judgment: Fair  Insight: Fair   Chartered certified accountant: Fair  Attention Span: Fair  Recall: [employer] of Knowledge: Fair  Language: Fair   Psychomotor Activity   Psychomotor Activity: Psychomotor Activity: Normal   Assets  Assets: Resilience   Sleep  Sleep: Fair   No data recorded   Physical Exam Vitals and nursing note reviewed.  Cardiovascular:     Rate and Rhythm: Normal rate.  Pulmonary:     Effort: Pulmonary effort is normal.  Musculoskeletal:        General: Normal range of motion.     Cervical back: Normal range of motion.  Neurological:     Mental Status: He is alert and oriented to person, place, and time.    Review of Systems  Constitutional: Negative.   HENT: Negative.    Eyes: Negative.   Respiratory: Negative.    Cardiovascular: Negative.   Gastrointestinal: Negative.   Genitourinary: Negative.   Neurological: Negative.   Psychiatric/Behavioral:  Positive for substance abuse. Negative for depression, hallucinations, memory loss and suicidal ideas. The patient is nervous/anxious and has insomnia.   All other systems reviewed and are negative.   Blood pressure 128/88, pulse 65, temperature 97.9 F (36.6 C), temperature source Oral, resp. rate 17, SpO2 100%. There is no height or weight on file to calculate BMI.  Past Psychiatric History: History of alcohol  use disorder.  Is the patient at risk to self? No  Has the patient been a risk to self in the past 6 months? No .    Has the patient been a risk to self within the distant past? No   Is the patient a risk to others? No   Has the patient been a risk to others in the past 6 months? No   Has the patient been a risk to others within the distant past? No   Past Medical History:Pelvis fracture three months ago.  Family History: No reported history  Social History: Patient reports that he lives between his parents and sister house. 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 He reports alcohol  use. He reports occasional marijuana use.  Last Labs:  Admission on 09/09/2024, Discharged on 09/09/2024  Component Date Value Ref Range Status   WBC  09/09/2024 8.9  4.0 - 10.5 K/uL Final   RBC 09/09/2024 4.89  4.22 - 5.81 MIL/uL Final   Hemoglobin 09/09/2024 15.1  13.0 - 17.0 g/dL Final   HCT 89/90/7974 45.7  39.0 - 52.0 % Final   MCV 09/09/2024 93.5  80.0 - 100.0 fL Final   MCH 09/09/2024 30.9  26.0 - 34.0 pg Final   MCHC 09/09/2024 33.0  30.0 - 36.0 g/dL Final   RDW 89/90/7974 13.2  11.5 - 15.5 % Final   Platelets 09/09/2024 356  150 - 400 K/uL Final   nRBC 09/09/2024 0.0  0.0 - 0.2 % Final   Neutrophils Relative % 09/09/2024 68  % Final   Neutro Abs 09/09/2024 6.2  1.7 - 7.7 K/uL Final   Lymphocytes Relative 09/09/2024 20  % Final   Lymphs Abs 09/09/2024 1.7  0.7 - 4.0 K/uL Final   Monocytes Relative 09/09/2024 10  % Final   Monocytes Absolute 09/09/2024 0.9  0.1 - 1.0 K/uL Final   Eosinophils Relative 09/09/2024 1  % Final   Eosinophils Absolute 09/09/2024 0.1  0.0 - 0.5 K/uL Final   Basophils Relative 09/09/2024  1  % Final   Basophils Absolute 09/09/2024 0.1  0.0 - 0.1 K/uL Final   Immature Granulocytes 09/09/2024 0  % Final   Abs Immature Granulocytes 09/09/2024 0.02  0.00 - 0.07 K/uL Final   Performed at [facility], [address] [place], [place] [address]   Sodium 09/09/2024 136  135 - 145 mmol/L Final   Potassium 09/09/2024 4.9  3.5 - 5.1 mmol/L Final   Chloride 09/09/2024 98  98 - 111 mmol/L Final   CO2 09/09/2024 27  22 - 32 mmol/L Final   Glucose, Bld 09/09/2024 91  70 - 99 mg/dL Final   Glucose reference range applies only to samples taken after fasting for at least 8 hours.   BUN 09/09/2024 11  6 - 20 mg/dL Final   Creatinine, Ser 09/09/2024 0.93  0.61 - 1.24 mg/dL Final   Calcium 89/90/7974 9.7  8.9 - 10.3 mg/dL Final   Total Protein 89/90/7974 7.7  6.5 - 8.1 g/dL Final   Albumin 89/90/7974 4.4  3.5 - 5.0 g/dL Final   AST 89/90/7974 28  15 - 41 U/L Final   ALT 09/09/2024 28  0 - 44 U/L Final   Alkaline Phosphatase 09/09/2024 105  38 - 126 U/L Final   Total Bilirubin 09/09/2024 1.4 (H)  0.0 - 1.2 mg/dL  Final   GFR, Estimated 09/09/2024 >60  >60 mL/min Final   Comment: (NOTE) Calculated using the CKD-EPI Creatinine Equation (2021)    Anion gap 09/09/2024 11  5 - 15 Final   Performed at [facility], [address] [place], [place] [address]   Hgb A1c MFr Bld 09/09/2024 4.7 (L)  4.8 - 5.6 % Final   Comment: (NOTE) Diagnosis of Diabetes The following HbA1c ranges recommended by the American Diabetes Association (ADA) may be used as an aid in the diagnosis of diabetes mellitus.  Hemoglobin             Suggested A1C NGSP%              Diagnosis  <5.7                   Non Diabetic  5.7-6.4                Pre-Diabetic  >6.4                   Diabetic  <7.0                   Glycemic control for                       adults with diabetes.     Mean Plasma Glucose 09/09/2024 88.19  mg/dL Final   Performed at [facility], [address], [place], [place] [address]   Magnesium  09/09/2024 2.3  1.7 - 2.4 mg/dL Final   Performed at [facility], [address] [place], [place] [address]   Alcohol , Ethyl (B) 09/09/2024 <15  <15 mg/dL Final   Comment: (NOTE) For medical purposes only. Performed at [facility], [address], [place], [place] [address]    Cholesterol 09/09/2024 237 (H)  0 - 200 mg/dL Final   Triglycerides 89/90/7974 99  <150 mg/dL Final   HDL 89/90/7974 107  >40 mg/dL Final   Total CHOL/HDL Ratio 09/09/2024 2.2  RATIO Final   VLDL 09/09/2024 20  0 - 40 mg/dL  Final   LDL Cholesterol 09/09/2024 110 (H)  0 - 99 mg/dL Final   Comment:        Total Cholesterol/HDL:CHD Risk Coronary Heart Disease Risk Table                     Men   Women  1/2 Average Risk   3.4   3.3  Average Risk       5.0   4.4  2 X Average Risk   9.6   7.1  3 X Average Risk  23.4   11.0        Use the calculated Patient Ratio above and the CHD Risk Table to determine the patient's CHD Risk.        ATP III CLASSIFICATION (LDL):  <100     mg/dL   Optimal  899-870   mg/dL   Near or Above                    Optimal  130-159  mg/dL   Borderline  839-810  mg/dL   High  >809     mg/dL   Very High Performed at [facility], [address], [place], [place] [address]    POC Amphetamine UR 09/09/2024 None Detected  NONE DETECTED (Cut Off Level 1000 ng/mL) Final   POC Secobarbital (BAR) 09/09/2024 None Detected  NONE DETECTED (Cut Off Level 300 ng/mL) Final   POC Buprenorphine (BUP) 09/09/2024 None Detected  NONE DETECTED (Cut Off Level 10 ng/mL) Final   POC Oxazepam (BZO) 09/09/2024 None Detected  NONE DETECTED (Cut Off Level 300 ng/mL) Final   POC Cocaine UR 09/09/2024 None Detected  NONE DETECTED (Cut Off Level 300 ng/mL) Final   POC Methamphetamine UR 09/09/2024 None Detected  NONE DETECTED (Cut Off Level 1000 ng/mL) Final   POC Morphine  09/09/2024 None Detected  NONE DETECTED (Cut Off Level 300 ng/mL) Final   POC Methadone UR 09/09/2024 None Detected  NONE DETECTED (Cut Off Level 300 ng/mL) Final   POC Oxycodone  UR 09/09/2024 None Detected  NONE DETECTED (Cut Off Level 100 ng/mL) Final   POC Marijuana UR 09/09/2024 Positive (A)  NONE DETECTED (Cut Off Level 50 ng/mL) Final   TSH 09/09/2024 2.609  0.350 - 4.500 uIU/mL Final   Comment: Performed by a 3rd Generation assay with a functional sensitivity of <=0.01 uIU/mL. Performed at [facility], [address], [place], [place] [address]   Admission on 06/18/2024, Discharged on 06/21/2024  Component Date Value Ref Range Status   WBC 06/18/2024 11.5 (H)  4.0 - 10.5 K/uL Final   RBC 06/18/2024 4.48  4.22 - 5.81 MIL/uL Final   Hemoglobin 06/18/2024 14.1  13.0 - 17.0 g/dL Final   HCT 92/81/7974 41.9  39.0 - 52.0 % Final   MCV 06/18/2024 93.5  80.0 - 100.0 fL Final   MCH 06/18/2024 31.5  26.0 - 34.0 pg Final   MCHC 06/18/2024 33.7  30.0 - 36.0 g/dL Final   RDW 92/81/7974 13.1  11.5 - 15.5 % Final   Platelets 06/18/2024 256  150 - 400 K/uL Final   nRBC 06/18/2024 0.0  0.0 - 0.2 % Final    Neutrophils Relative % 06/18/2024 64  % Final   Neutro Abs 06/18/2024 7.3  1.7 - 7.7 K/uL Final   Lymphocytes Relative 06/18/2024 25  % Final   Lymphs Abs 06/18/2024 2.9  0.7 - 4.0 K/uL Final   Monocytes Relative 06/18/2024 7  %  Final   Monocytes Absolute 06/18/2024 0.8  0.1 - 1.0 K/uL Final   Eosinophils Relative 06/18/2024 1  % Final   Eosinophils Absolute 06/18/2024 0.2  0.0 - 0.5 K/uL Final   Basophils Relative 06/18/2024 1  % Final   Basophils Absolute 06/18/2024 0.1  0.0 - 0.1 K/uL Final   Immature Granulocytes 06/18/2024 2  % Final   Abs Immature Granulocytes 06/18/2024 0.22 (H)  0.00 - 0.07 K/uL Final   Performed at [facility], [address], [place], [place] [address]   Sodium 06/18/2024 143  135 - 145 mmol/L Final   Potassium 06/18/2024 3.5  3.5 - 5.1 mmol/L Final   Chloride 06/18/2024 107  98 - 111 mmol/L Final   CO2 06/18/2024 23  22 - 32 mmol/L Final   Glucose, Bld 06/18/2024 95  70 - 99 mg/dL Final   Glucose reference range applies only to samples taken after fasting for at least 8 hours.   BUN 06/18/2024 9  6 - 20 mg/dL Final   Creatinine, Ser 06/18/2024 0.86  0.61 - 1.24 mg/dL Final   Calcium 92/81/7974 8.6 (L)  8.9 - 10.3 mg/dL Final   Total Protein 92/81/7974 6.4 (L)  6.5 - 8.1 g/dL Final   Albumin 92/81/7974 3.9  3.5 - 5.0 g/dL Final   AST 92/81/7974 99 (H)  15 - 41 U/L Final   ALT 06/18/2024 82 (H)  0 - 44 U/L Final   Alkaline Phosphatase 06/18/2024 62  38 - 126 U/L Final   Total Bilirubin 06/18/2024 0.9  0.0 - 1.2 mg/dL Final   GFR, Estimated 06/18/2024 >60  >60 mL/min Final   Comment: (NOTE) Calculated using the CKD-EPI Creatinine Equation (2021)    Anion gap 06/18/2024 13  5 - 15 Final   Performed at [facility], [address], [place], [place] [address]   Sodium 06/18/2024 143  135 - 145 mmol/L Final   Potassium 06/18/2024 3.4 (L)  3.5 - 5.1 mmol/L Final   Chloride 06/18/2024 107  98 - 111 mmol/L Final   BUN 06/18/2024 10  6 - 20 mg/dL  Final   Creatinine, Ser 06/18/2024 1.30 (H)  0.61 - 1.24 mg/dL Final   Glucose, Bld 92/81/7974 92  70 - 99 mg/dL Final   Glucose reference range applies only to samples taken after fasting for at least 8 hours.   Calcium, Ion 06/18/2024 1.03 (L)  1.15 - 1.40 mmol/L Final   TCO2 06/18/2024 23  22 - 32 mmol/L Final   Hemoglobin 06/18/2024 14.6  13.0 - 17.0 g/dL Final   HCT 92/81/7974 43.0  39.0 - 52.0 % Final   Lactic Acid, Venous 06/18/2024 2.3 (HH)  0.5 - 1.9 mmol/L Final   Comment 06/18/2024 NOTIFIED PHYSICIAN   Final   Alcohol , Ethyl (B) 06/18/2024 283 (H)  <15 mg/dL Final   Comment: (NOTE) For medical purposes only. Performed at [facility], [address] [place], [place] [address]    HIV Screen 4th Generation wRfx 06/19/2024 Non Reactive  Non Reactive Final   Performed at [facility], [address] [place], [place] [address]   WBC 06/19/2024 9.6  4.0 - 10.5 K/uL Final   RBC 06/19/2024 3.81 (L)  4.22 - 5.81 MIL/uL Final   Hemoglobin 06/19/2024 12.2 (L)  13.0 - 17.0 g/dL Final   HCT 92/80/7974 36.1 (L)  39.0 - 52.0 % Final   MCV 06/19/2024 94.8  80.0 - 100.0 fL Final  MCH 06/19/2024 32.0  26.0 - 34.0 pg Final   MCHC 06/19/2024 33.8  30.0 - 36.0 g/dL Final   RDW 92/80/7974 13.7  11.5 - 15.5 % Final   Platelets 06/19/2024 190  150 - 400 K/uL Final   nRBC 06/19/2024 0.0  0.0 - 0.2 % Final   Performed at [facility], [address] [place], [place] [address]   Sodium 06/19/2024 142  135 - 145 mmol/L Final   Potassium 06/19/2024 3.8  3.5 - 5.1 mmol/L Final   Chloride 06/19/2024 106  98 - 111 mmol/L Final   CO2 06/19/2024 26  22 - 32 mmol/L Final   Glucose, Bld 06/19/2024 86  70 - 99 mg/dL Final   Glucose reference range applies only to samples taken after fasting for at least 8 hours.   BUN 06/19/2024 8  6 - 20 mg/dL Final   Creatinine, Ser 06/19/2024 0.98  0.61 - 1.24 mg/dL Final   Calcium 92/80/7974 8.5 (L)  8.9 - 10.3 mg/dL Final   GFR, Estimated  06/19/2024 >60  >60 mL/min Final   Comment: (NOTE) Calculated using the CKD-EPI Creatinine Equation (2021)    Anion gap 06/19/2024 10  5 - 15 Final   Performed at [facility], [address] [place], [place] [address]   WBC 06/21/2024 8.3  4.0 - 10.5 K/uL Final   RBC 06/21/2024 3.92 (L)  4.22 - 5.81 MIL/uL Final   Hemoglobin 06/21/2024 12.2 (L)  13.0 - 17.0 g/dL Final   HCT 92/78/7974 35.6 (L)  39.0 - 52.0 % Final   MCV 06/21/2024 90.8  80.0 - 100.0 fL Final   MCH 06/21/2024 31.1  26.0 - 34.0 pg Final   MCHC 06/21/2024 34.3  30.0 - 36.0 g/dL Final   RDW 92/78/7974 12.8  11.5 - 15.5 % Final   Platelets 06/21/2024 165  150 - 400 K/uL Final   nRBC 06/21/2024 0.0  0.0 - 0.2 % Final   Performed at [facility], [address] [place], [place] [address]   Sodium 06/21/2024 135  135 - 145 mmol/L Final   Potassium 06/21/2024 3.4 (L)  3.5 - 5.1 mmol/L Final   Chloride 06/21/2024 101  98 - 111 mmol/L Final   CO2 06/21/2024 23  22 - 32 mmol/L Final   Glucose, Bld 06/21/2024 98  70 - 99 mg/dL Final   Glucose reference range applies only to samples taken after fasting for at least 8 hours.   BUN 06/21/2024 8  6 - 20 mg/dL Final   Creatinine, Ser 06/21/2024 0.85  0.61 - 1.24 mg/dL Final   Calcium 92/78/7974 8.5 (L)  8.9 - 10.3 mg/dL Final   GFR, Estimated 06/21/2024 >60  >60 mL/min Final   Comment: (NOTE) Calculated using the CKD-EPI Creatinine Equation (2021)    Anion gap 06/21/2024 11  5 - 15 Final   Performed at [facility], [address] [place], [place] [address]   Magnesium  06/21/2024 2.0  1.7 - 2.4 mg/dL Final   Performed at [facility], [address], [place], [place] [address]    Allergies: Patient has no known allergies.  Medications:  Facility Ordered Medications  Medication   [COMPLETED] cloNIDine (CATAPRES) tablet 0.1 mg   [COMPLETED] thiamine  (VITAMIN B1) injection 100 mg   acetaminophen  (TYLENOL ) tablet 650 mg   alum & mag hydroxide-simeth  (MAALOX/MYLANTA) 200-200-20 MG/5ML suspension 30 mL   magnesium  hydroxide (MILK OF MAGNESIA) suspension 30 mL  haloperidol (HALDOL) tablet 5 mg   And   diphenhydrAMINE (BENADRYL) capsule 50 mg   haloperidol lactate (HALDOL) injection 5 mg   And   diphenhydrAMINE (BENADRYL) injection 50 mg   And   LORazepam  (ATIVAN ) injection 2 mg   haloperidol lactate (HALDOL) injection 10 mg   And   diphenhydrAMINE (BENADRYL) injection 50 mg   And   LORazepam  (ATIVAN ) injection 2 mg   hydrOXYzine  (ATARAX ) tablet 25 mg   thiamine  (VITAMIN B1) tablet 100 mg   multivitamin with minerals tablet 1 tablet   LORazepam  (ATIVAN ) tablet 1 mg   hydrOXYzine  (ATARAX ) tablet 25 mg   loperamide  (IMODIUM ) capsule 2-4 mg   ondansetron  (ZOFRAN -ODT) disintegrating tablet 4 mg   [COMPLETED] LORazepam  (ATIVAN ) tablet 1 mg   Followed by   LORazepam  (ATIVAN ) tablet 1 mg   Followed by   NOREEN ON 09/12/2024] LORazepam  (ATIVAN ) tablet 1 mg   Followed by   NOREEN ON 09/14/2024] LORazepam  (ATIVAN ) tablet 1 mg   nicotine  (NICODERM CQ  - dosed in mg/24 hours) patch 21 mg   cloNIDine (CATAPRES) tablet 0.1 mg   ibuprofen  (ADVIL ) tablet 600 mg   traZODone  (DESYREL ) tablet 50 mg   PTA Medications  Medication Sig   acetaminophen  (TYLENOL ) 500 MG tablet Take 2 tablets (1,000 mg total) by mouth every 6 (six) hours as needed. (Patient taking differently: Take 1,000-1,500 mg by mouth every 6 (six) hours as needed (For pain).)    Long Term Goals: Improvement in symptoms so as ready for discharge  Short Term Goals: Patient will verbalize feelings in meetings with treatment team members., Patient will attend at least of 50% of the groups daily., Pt will complete the PHQ9 on admission, day 3 and discharge., and Patient will take medications as prescribed daily.  Medical Decision Making  BRANSON KRANZ is a 53 year old male patient with a past psychiatric history significant for alcohol  use disorder severe dependence who was  admitted to the [facility] on 09/09/2024 for alcohol  use. Patient reports drinking a pint of vodka daily since 2020 and states that he last consumed alcohol  late last night on 09/08/2024. BAL negative on arrival. UDS positive for marijuana.  Labs  UDS positive for marijuana  BAL negative   Medication regimen  Continue Ativan  taper 1 mg, ends on 10/14 Continue clonidine 0.1 mg po as needed for elevated blood pressure Add ibuprofen  600 mg TID prn for pain  -Start Gabapentin  100 mg TID for GAD/pain/ETOH use -Start Trazodone  50 mg nightly porn for sleep  Disposition anticipated discharge 10/14. Patient interested in [facility] IOP. However, facility does not accept patient's insurance. Care management team, working with patient to establish outpatient services for substance abuse. Will provide patient with a handout on [facility] an online resource for AUD  Recommendations  Based on my evaluation the patient does not appear to have an emergency medical condition.  Donia Snell, NP 09/11/24  3:08 PM

## 2024-09-11 NOTE — ED Notes (Signed)
 Pt sitting in dayroom watching television and interacting with peers. No acute distress noted. No concerns voiced. Informed pt to notify staff with any needs or assistance. Pt verbalized understanding and agreement. Will continue to monitor for safety.

## 2024-09-11 NOTE — Group Note (Signed)
 Group Topic: Understanding Self  Group Date: 09/11/2024 Start Time: 1210 End Time: 1230 Facilitators: Herold Lajuana NOVAK, RN  Department: [facility]  Number of Participants: 8  Group Focus: coping skills Treatment Modality:  Leisure Development Interventions utilized were leisure development Purpose: relapse prevention strategies  Name: Timothy Estrada Date of Birth: [birth year]  MR: [id number]    Level of Participation: when cued Quality of Participation: cooperative Interactions with others: gave feedback Mood/Affect: appropriate Triggers (if applicable): none identified Cognition: coherent/clear Progress: Gaining insight Response: 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 I also love riding my mountain bike. That helps Plan: patient will be encouraged to utilize coping strategies to manage cravings  Patients Problems:  Patient Active Problem List   Diagnosis Date Noted   Alcohol  use disorder 09/09/2024   Fall 06/19/2024   Alcohol  use disorder, severe, dependence (HCC) 06/12/2023

## 2024-09-11 NOTE — Group Note (Signed)
 Group Topic: Relapse and Recovery  Group Date: 09/11/2024 Start Time: 2000 End Time: 2100 Facilitators: Joan Plowman B  Department: [facility]  Number of Participants: 8  Group Focus: abuse issues and co-dependency Treatment Modality:  Psychoeducation and Spiritual Interventions utilized were leisure development Purpose: enhance coping skills and relapse prevention strategies  Name: Timothy Estrada Date of Birth: 01/19/1971  MR: [id number]    Level of Participation: active Quality of Participation: attentive Interactions with others: gave feedback Mood/Affect: appropriate Triggers (if applicable): NA Cognition: coherent/clear Progress: Gaining insight Response: NA Plan: patient will be encouraged to keep going to groups  Patients Problems:  Patient Active Problem List   Diagnosis Date Noted   Alcohol  use disorder 09/09/2024   Fall 06/19/2024   Alcohol  use disorder, severe, dependence (HCC) 06/12/2023

## 2024-09-11 NOTE — ED Notes (Signed)
Patient A&Ox4. Denies intent to harm self/others when asked. Denies A/VH. Patient denies any physical complaints when asked. No acute distress noted. Routine safety checks conducted according to facility protocol. Encouraged patient to notify staff if thoughts of harm toward self or others arise. Patient verbalize understanding and agreement. Will continue to monitor for safety.    

## 2024-09-11 NOTE — ED Notes (Signed)
 Pt is sleeping at the moment in his room. No acute distress noted. Q15 safety checks in place.

## 2024-09-11 NOTE — ED Notes (Signed)
 Pt is sleeping in his room. No acute distress noted.Q15 safety checks in place.

## 2024-09-11 NOTE — ED Notes (Signed)
 Pt is sleeping at the moment. No acute distress noted. Respirations are even and labored. Q15 safety checks in place.

## 2024-09-11 NOTE — Group Note (Signed)
 Group Topic: Wellness  Group Date: 09/11/2024 Start Time: 1300 End Time: 1345 Facilitators: Alyse Leilani LABOR, NT  Department: [facility]  Number of Participants: 9  Group Focus: activities of daily living skills and chemical dependency issues Treatment Modality:  Patient-Centered Therapy, Psychoeducation, and Skills Training Interventions utilized were clarification and mental fitness Purpose: enhance coping skills, express feelings, and improve communication skills  Name: Timothy Estrada Date of Birth: Oct 15, 1971  MR: [id number]    Level of Participation: active Quality of Participation: supportive Interactions with others: gave feedback Mood/Affect: positive Triggers (if applicable): none Cognition: coherent/clear Progress: Gaining insight Response: none Plan: patient will be encouraged to keep attending groups.   Patients Problems:  Patient Active Problem List   Diagnosis Date Noted   Alcohol  use disorder 09/09/2024   Fall 06/19/2024   Alcohol  use disorder, severe, dependence (HCC) 06/12/2023

## 2024-09-12 DIAGNOSIS — F1020 Alcohol dependence, uncomplicated: Secondary | ICD-10-CM | POA: Diagnosis not present

## 2024-09-12 DIAGNOSIS — F411 Generalized anxiety disorder: Secondary | ICD-10-CM | POA: Diagnosis not present

## 2024-09-12 MED ORDER — NICOTINE POLACRILEX 2 MG MT GUM: 2.0000 mg | CHEWING_GUM | Freq: Once | OROMUCOSAL | Status: DC

## 2024-09-12 NOTE — Group Note (Signed)
 Group Topic: Healthy Self Image and Positive Change  Group Date: 09/12/2024 Start Time: 2000 End Time: 2030 Facilitators: Anice Benton LABOR, NT  Department: [facility]  Number of Participants: 5  Group Focus: goals/reality orientation Treatment Modality:  Individual Therapy Interventions utilized were assignment Purpose: express feelings and increase insight  Name: Timothy Estrada Date of Birth: 1971-02-07  MR: [id number]    Level of Participation: active Quality of Participation: attentive and cooperative Interactions with others: gave feedback Mood/Affect: appropriate Triggers (if applicable): N/A Cognition: coherent/clear Progress: Moderate Response: Good Plan: follow-up needed  Patients Problems:  Patient Active Problem List   Diagnosis Date Noted   Alcohol  use disorder 09/09/2024   Fall 06/19/2024   Alcohol  use disorder, severe, dependence (HCC) 06/12/2023

## 2024-09-12 NOTE — Group Note (Signed)
 Group Topic: Healthy Self Image and Positive Change  Group Date: 09/12/2024 Start Time: 1400 End Time: 1445 Facilitators: Elnor Keven SAILOR  Department: [facility]  Number of Participants: 7  Group Focus: affirmation Treatment Modality:  Psychoeducation Interventions utilized were support Purpose: reinforce self-care  Name: Timothy Estrada Date of Birth: [birth year]  MR: [id number]    Level of Participation: moderate Quality of Participation: attentive Interactions with others: gave feedback Mood/Affect: appropriate Triggers (if applicable): NA Cognition: coherent/clear Progress: Moderate Response: Pt shared that his affirmation of choice was I am awake and ready to be awesome Pt shared he has a support system outside of here that he will rely on when he is discharged, an old [employer], broadcasting/film/video. Pt shares he wants his family and friends to be as confident in his recovery as he is.  Plan: follow-up needed  Patients Problems:  Patient Active Problem List   Diagnosis Date Noted   Alcohol  use disorder 09/09/2024   Fall 06/19/2024   Alcohol  use disorder, severe, dependence (HCC) 06/12/2023

## 2024-09-12 NOTE — ED Notes (Signed)
 Pt is sleeping. No acute distress noted. Respirations are even and labored. Q15 safety checks in place.

## 2024-09-12 NOTE — ED Notes (Signed)
 Patient A&Ox4. Denies intent to harm self/others when asked. Denies A/VH. Patient denies any physical complaints when asked. No acute distress noted. Support and encouragement provided. Routine safety checks conducted according to facility protocol. Encouraged patient to notify staff if thoughts of harm toward self or others arise. Patient verbalize understanding and agreement. Will continue to monitor for safety.

## 2024-09-12 NOTE — Group Note (Signed)
 Group Topic: Communication  Group Date: 09/12/2024 Start Time: 0915 End Time: 0945 Facilitators: Herold Lajuana NOVAK, RN  Department: [facility]  Number of Participants: 7  Group Focus: daily focus Treatment Modality:  Individual Therapy Interventions utilized were patient education Purpose: increase insight  Name: BANYAN GOODCHILD Date of Birth: [birth year]  MR: [id number]    Level of Participation: moderate Quality of Participation: attentive Interactions with others: gave feedback Mood/Affect: appropriate Triggers (if applicable): none identified Cognition: coherent/clear Progress: Gaining insight Response: pt verbalized indication of medication taken this am Plan: patient will be encouraged to seek staff with any SE from medications taken  Patients Problems:  Patient Active Problem List   Diagnosis Date Noted   Alcohol  use disorder 09/09/2024   Fall 06/19/2024   Alcohol  use disorder, severe, dependence (HCC) 06/12/2023

## 2024-09-12 NOTE — ED Notes (Signed)
 Pt sitting in dayroom watching television and interacting with peers. No acute distress noted. No concerns voiced. Informed pt to notify staff with any needs or assistance. Pt verbalized understanding and agreement. Will continue to monitor for safety.

## 2024-09-12 NOTE — ED Provider Notes (Addendum)
 Behavioral Health Progress Note  Date and Time: 09/12/2024 12:08 PM Name: Timothy Estrada MRN:  [id number]  Subjective:  Lang reported I need a long term placement.   Evaluation: Timothy Estrada 53 year old male was seen and evaluated face-to-face by this provider.  Carries a diagnosis related to alcohol  abuse disorder.  UDS positive for marijuana.  Patient was initiated on detox taper while on facility based crisis unit.  He denied suicidal or homicidal ideations.  Denied auditory visual hallucinations.  States that he did attend AA meeting on last night.  States he is hopeful to follow-up with the residential treatment facility after discharge.  Chart reviewed multiple admissions for substance-induced disorder.  Denied that he is followed by therapy or psychiatry currently.  Hopeful to attend to chemical dependency outpatient programs.  May benefit from Disulfroam and/or Vivitrol . patient requested to be changed from nicotine  patch and nicotine  gum.  Reports a good appetite.  States he is rested well throughout the night.    Anticipated discharge 09/14/2024.  Support, encouragement and reassurance was provided.  Per admission assessment note: Timothy Estrada is a 53 y.o. male.  With a history of alcohol  use disorder presenting to the ED for evaluation of alcohol  intoxication.  Diagnosis:  Final diagnoses:  Alcohol  use disorder, severe, dependence (HCC)  GAD (generalized anxiety disorder)    Total Time spent with patient: 15 minutes  Additional Social History:        Sleep: Good  Appetite:  Good  Current Medications:  Current Facility-Administered Medications  Medication Dose Route Frequency Provider Last Rate Last Admin   acetaminophen  (TYLENOL ) tablet 650 mg  650 mg Oral Q6H PRN Brent, Amanda C, NP   650 mg at 09/10/24 0941   alum & mag hydroxide-simeth (MAALOX/MYLANTA) 200-200-20 MG/5ML suspension 30 mL  30 mL Oral Q4H PRN Brent, Amanda C, NP       cloNIDine (CATAPRES)  tablet 0.1 mg  0.1 mg Oral Q8H PRN White, Patrice L, NP   0.1 mg at 09/11/24 2109   haloperidol (HALDOL) tablet 5 mg  5 mg Oral TID PRN Brent, Amanda C, NP       And   diphenhydrAMINE (BENADRYL) capsule 50 mg  50 mg Oral TID PRN Brent, Amanda C, NP       haloperidol lactate (HALDOL) injection 5 mg  5 mg Intramuscular TID PRN Brent, Amanda C, NP       And   diphenhydrAMINE (BENADRYL) injection 50 mg  50 mg Intramuscular TID PRN Brent, Amanda C, NP       And   LORazepam  (ATIVAN ) injection 2 mg  2 mg Intramuscular TID PRN Brent, Amanda C, NP       haloperidol lactate (HALDOL) injection 10 mg  10 mg Intramuscular TID PRN Brent, Amanda C, NP       And   diphenhydrAMINE (BENADRYL) injection 50 mg  50 mg Intramuscular TID PRN Brent, Amanda C, NP       And   LORazepam  (ATIVAN ) injection 2 mg  2 mg Intramuscular TID PRN Brent, Amanda C, NP       gabapentin  (NEURONTIN ) capsule 100 mg  100 mg Oral TID [name], NP   100 mg at 09/12/24 0919   hydrOXYzine  (ATARAX ) tablet 25 mg  25 mg Oral TID PRN Brent, Amanda C, NP       hydrOXYzine  (ATARAX ) tablet 25 mg  25 mg Oral Q6H PRN Brent, Amanda C, NP       ibuprofen  (  ADVIL ) tablet 600 mg  600 mg Oral Q8H PRN White, Patrice L, NP   600 mg at 09/11/24 2108   loperamide  (IMODIUM ) capsule 2-4 mg  2-4 mg Oral PRN Brent, Amanda C, NP       LORazepam  (ATIVAN ) tablet 1 mg  1 mg Oral Q6H PRN Brent, Amanda C, NP       LORazepam  (ATIVAN ) tablet 1 mg  1 mg Oral TID Brent, Amanda C, NP   1 mg at 09/12/24 9080   Followed by   LORazepam  (ATIVAN ) tablet 1 mg  1 mg Oral BID Brent, Amanda C, NP       Followed by   NOREEN ON 09/14/2024] LORazepam  (ATIVAN ) tablet 1 mg  1 mg Oral Daily Brent, Amanda C, NP       magnesium  hydroxide (MILK OF MAGNESIA) suspension 30 mL  30 mL Oral Daily PRN Brent, Amanda C, NP       multivitamin with minerals tablet 1 tablet  1 tablet Oral Daily Brent, Amanda C, NP   1 tablet at 09/12/24 9080   nicotine  (NICODERM CQ  - dosed in mg/24 hours)  patch 21 mg  21 mg Transdermal Daily White, Patrice L, NP   21 mg at 09/12/24 9081   nicotine  polacrilex (NICORETTE ) gum 2 mg  2 mg Oral PRN Nkwenti, Doris, NP       ondansetron  (ZOFRAN -ODT) disintegrating tablet 4 mg  4 mg Oral Q6H PRN Brent, Amanda C, NP       thiamine  (VITAMIN B1) tablet 100 mg  100 mg Oral Daily Brent, Amanda C, NP   100 mg at 09/12/24 9080   traZODone  (DESYREL ) tablet 50 mg  50 mg Oral QHS PRN Bobbitt, Shalon E, NP   50 mg at 09/11/24 2108   Current Outpatient Medications  Medication Sig Dispense Refill   acetaminophen  (TYLENOL ) 500 MG tablet Take 2 tablets (1,000 mg total) by mouth every 6 (six) hours as needed. (Patient taking differently: Take 1,000-1,500 mg by mouth every 6 (six) hours as needed (For pain).)      Labs  Lab Results:  Admission on 09/09/2024, Discharged on 09/09/2024  Component Date Value Ref Range Status   WBC 09/09/2024 8.9  4.0 - 10.5 K/uL Final   RBC 09/09/2024 4.89  4.22 - 5.81 MIL/uL Final   Hemoglobin 09/09/2024 15.1  13.0 - 17.0 g/dL Final   HCT 89/90/7974 45.7  39.0 - 52.0 % Final   MCV 09/09/2024 93.5  80.0 - 100.0 fL Final   MCH 09/09/2024 30.9  26.0 - 34.0 pg Final   MCHC 09/09/2024 33.0  30.0 - 36.0 g/dL Final   RDW 89/90/7974 13.2  11.5 - 15.5 % Final   Platelets 09/09/2024 356  150 - 400 K/uL Final   nRBC 09/09/2024 0.0  0.0 - 0.2 % Final   Neutrophils Relative % 09/09/2024 68  % Final   Neutro Abs 09/09/2024 6.2  1.7 - 7.7 K/uL Final   Lymphocytes Relative 09/09/2024 20  % Final   Lymphs Abs 09/09/2024 1.7  0.7 - 4.0 K/uL Final   Monocytes Relative 09/09/2024 10  % Final   Monocytes Absolute 09/09/2024 0.9  0.1 - 1.0 K/uL Final   Eosinophils Relative 09/09/2024 1  % Final   Eosinophils Absolute 09/09/2024 0.1  0.0 - 0.5 K/uL Final   Basophils Relative 09/09/2024 1  % Final   Basophils Absolute 09/09/2024 0.1  0.0 - 0.1 K/uL Final   Immature Granulocytes 09/09/2024 0  % Final  Abs Immature Granulocytes 09/09/2024 0.02  0.00  - 0.07 K/uL Final   Performed at [facility], [address], [place], [place] [address]   Sodium 09/09/2024 136  135 - 145 mmol/L Final   Potassium 09/09/2024 4.9  3.5 - 5.1 mmol/L Final   Chloride 09/09/2024 98  98 - 111 mmol/L Final   CO2 09/09/2024 27  22 - 32 mmol/L Final   Glucose, Bld 09/09/2024 91  70 - 99 mg/dL Final   Glucose reference range applies only to samples taken after fasting for at least 8 hours.   BUN 09/09/2024 11  6 - 20 mg/dL Final   Creatinine, Ser 09/09/2024 0.93  0.61 - 1.24 mg/dL Final   Calcium 89/90/7974 9.7  8.9 - 10.3 mg/dL Final   Total Protein 89/90/7974 7.7  6.5 - 8.1 g/dL Final   Albumin 89/90/7974 4.4  3.5 - 5.0 g/dL Final   AST 89/90/7974 28  15 - 41 U/L Final   ALT 09/09/2024 28  0 - 44 U/L Final   Alkaline Phosphatase 09/09/2024 105  38 - 126 U/L Final   Total Bilirubin 09/09/2024 1.4 (H)  0.0 - 1.2 mg/dL Final   GFR, Estimated 09/09/2024 >60  >60 mL/min Final   Comment: (NOTE) Calculated using the CKD-EPI Creatinine Equation (2021)    Anion gap 09/09/2024 11  5 - 15 Final   Performed at [facility], [address], [place], [place] [address]   Hgb A1c MFr Bld 09/09/2024 4.7 (L)  4.8 - 5.6 % Final   Comment: (NOTE) Diagnosis of Diabetes The following HbA1c ranges recommended by the American Diabetes Association (ADA) may be used as an aid in the diagnosis of diabetes mellitus.  Hemoglobin             Suggested A1C NGSP%              Diagnosis  <5.7                   Non Diabetic  5.7-6.4                Pre-Diabetic  >6.4                   Diabetic  <7.0                   Glycemic control for                       adults with diabetes.     Mean Plasma Glucose 09/09/2024 88.19  mg/dL Final   Performed at [facility], [address] [place], [place] [address]   Magnesium  09/09/2024 2.3  1.7 - 2.4 mg/dL Final   Performed at [facility], [address] [place], [place] [address]   Alcohol , Ethyl (B)  09/09/2024 <15  <15 mg/dL Final   Comment: (NOTE) For medical purposes only. Performed at [facility], [address] [place], [place] [address]    Cholesterol 09/09/2024 237 (H)  0 - 200 mg/dL Final   Triglycerides 89/90/7974 99  <150 mg/dL Final   HDL 89/90/7974 107  >40 mg/dL Final   Total CHOL/HDL Ratio 09/09/2024 2.2  RATIO Final   VLDL 09/09/2024 20  0 - 40 mg/dL Final   LDL Cholesterol 09/09/2024 110 (H)  0 - 99 mg/dL Final   Comment:        Total Cholesterol/HDL:CHD  Risk Coronary Heart Disease Risk Table                     Men   Women  1/2 Average Risk   3.4   3.3  Average Risk       5.0   4.4  2 X Average Risk   9.6   7.1  3 X Average Risk  23.4   11.0        Use the calculated Patient Ratio above and the CHD Risk Table to determine the patient's CHD Risk.        ATP III CLASSIFICATION (LDL):  <100     mg/dL   Optimal  899-870  mg/dL   Near or Above                    Optimal  130-159  mg/dL   Borderline  839-810  mg/dL   High  >809     mg/dL   Very High Performed at [facility], [address] [place], [place] [address]    POC Amphetamine UR 09/09/2024 None Detected  NONE DETECTED (Cut Off Level 1000 ng/mL) Final   POC Secobarbital (BAR) 09/09/2024 None Detected  NONE DETECTED (Cut Off Level 300 ng/mL) Final   POC Buprenorphine (BUP) 09/09/2024 None Detected  NONE DETECTED (Cut Off Level 10 ng/mL) Final   POC Oxazepam (BZO) 09/09/2024 None Detected  NONE DETECTED (Cut Off Level 300 ng/mL) Final   POC Cocaine UR 09/09/2024 None Detected  NONE DETECTED (Cut Off Level 300 ng/mL) Final   POC Methamphetamine UR 09/09/2024 None Detected  NONE DETECTED (Cut Off Level 1000 ng/mL) Final   POC Morphine  09/09/2024 None Detected  NONE DETECTED (Cut Off Level 300 ng/mL) Final   POC Methadone UR 09/09/2024 None Detected  NONE DETECTED (Cut Off Level 300 ng/mL) Final   POC Oxycodone  UR 09/09/2024 None Detected  NONE DETECTED (Cut Off Level 100 ng/mL) Final   POC  Marijuana UR 09/09/2024 Positive (A)  NONE DETECTED (Cut Off Level 50 ng/mL) Final   TSH 09/09/2024 2.609  0.350 - 4.500 uIU/mL Final   Comment: Performed by a 3rd Generation assay with a functional sensitivity of <=0.01 uIU/mL. Performed at [facility], [address] [place], [place] [address]   Admission on 06/18/2024, Discharged on 06/21/2024  Component Date Value Ref Range Status   WBC 06/18/2024 11.5 (H)  4.0 - 10.5 K/uL Final   RBC 06/18/2024 4.48  4.22 - 5.81 MIL/uL Final   Hemoglobin 06/18/2024 14.1  13.0 - 17.0 g/dL Final   HCT 92/81/7974 41.9  39.0 - 52.0 % Final   MCV 06/18/2024 93.5  80.0 - 100.0 fL Final   MCH 06/18/2024 31.5  26.0 - 34.0 pg Final   MCHC 06/18/2024 33.7  30.0 - 36.0 g/dL Final   RDW 92/81/7974 13.1  11.5 - 15.5 % Final   Platelets 06/18/2024 256  150 - 400 K/uL Final   nRBC 06/18/2024 0.0  0.0 - 0.2 % Final   Neutrophils Relative % 06/18/2024 64  % Final   Neutro Abs 06/18/2024 7.3  1.7 - 7.7 K/uL Final   Lymphocytes Relative 06/18/2024 25  % Final   Lymphs Abs 06/18/2024 2.9  0.7 - 4.0 K/uL Final   Monocytes Relative 06/18/2024 7  % Final   Monocytes Absolute 06/18/2024 0.8  0.1 - 1.0 K/uL Final   Eosinophils Relative 06/18/2024 1  % Final   Eosinophils Absolute  06/18/2024 0.2  0.0 - 0.5 K/uL Final   Basophils Relative 06/18/2024 1  % Final   Basophils Absolute 06/18/2024 0.1  0.0 - 0.1 K/uL Final   Immature Granulocytes 06/18/2024 2  % Final   Abs Immature Granulocytes 06/18/2024 0.22 (H)  0.00 - 0.07 K/uL Final   Performed at [facility], [address] [place], [place] [address]   Sodium 06/18/2024 143  135 - 145 mmol/L Final   Potassium 06/18/2024 3.5  3.5 - 5.1 mmol/L Final   Chloride 06/18/2024 107  98 - 111 mmol/L Final   CO2 06/18/2024 23  22 - 32 mmol/L Final   Glucose, Bld 06/18/2024 95  70 - 99 mg/dL Final   Glucose reference range applies only to samples taken after fasting for at least 8 hours.   BUN 06/18/2024 9  6 - 20  mg/dL Final   Creatinine, Ser 06/18/2024 0.86  0.61 - 1.24 mg/dL Final   Calcium 92/81/7974 8.6 (L)  8.9 - 10.3 mg/dL Final   Total Protein 92/81/7974 6.4 (L)  6.5 - 8.1 g/dL Final   Albumin 92/81/7974 3.9  3.5 - 5.0 g/dL Final   AST 92/81/7974 99 (H)  15 - 41 U/L Final   ALT 06/18/2024 82 (H)  0 - 44 U/L Final   Alkaline Phosphatase 06/18/2024 62  38 - 126 U/L Final   Total Bilirubin 06/18/2024 0.9  0.0 - 1.2 mg/dL Final   GFR, Estimated 06/18/2024 >60  >60 mL/min Final   Comment: (NOTE) Calculated using the CKD-EPI Creatinine Equation (2021)    Anion gap 06/18/2024 13  5 - 15 Final   Performed at [facility], [address], [place], [place] [address]   Sodium 06/18/2024 143  135 - 145 mmol/L Final   Potassium 06/18/2024 3.4 (L)  3.5 - 5.1 mmol/L Final   Chloride 06/18/2024 107  98 - 111 mmol/L Final   BUN 06/18/2024 10  6 - 20 mg/dL Final   Creatinine, Ser 06/18/2024 1.30 (H)  0.61 - 1.24 mg/dL Final   Glucose, Bld 92/81/7974 92  70 - 99 mg/dL Final   Glucose reference range applies only to samples taken after fasting for at least 8 hours.   Calcium, Ion 06/18/2024 1.03 (L)  1.15 - 1.40 mmol/L Final   TCO2 06/18/2024 23  22 - 32 mmol/L Final   Hemoglobin 06/18/2024 14.6  13.0 - 17.0 g/dL Final   HCT 92/81/7974 43.0  39.0 - 52.0 % Final   Lactic Acid, Venous 06/18/2024 2.3 (HH)  0.5 - 1.9 mmol/L Final   Comment 06/18/2024 NOTIFIED PHYSICIAN   Final   Alcohol , Ethyl (B) 06/18/2024 283 (H)  <15 mg/dL Final   Comment: (NOTE) For medical purposes only. Performed at [facility], [address], [place], [place] [address]    HIV Screen 4th Generation wRfx 06/19/2024 Non Reactive  Non Reactive Final   Performed at [facility], [address], [place], [place] [address]   WBC 06/19/2024 9.6  4.0 - 10.5 K/uL Final   RBC 06/19/2024 3.81 (L)  4.22 - 5.81 MIL/uL Final   Hemoglobin 06/19/2024 12.2 (L)  13.0 - 17.0 g/dL Final   HCT 92/80/7974 36.1 (L)  39.0 - 52.0 %  Final   MCV 06/19/2024 94.8  80.0 - 100.0 fL Final   MCH 06/19/2024 32.0  26.0 - 34.0 pg Final   MCHC 06/19/2024 33.8  30.0 - 36.0 g/dL Final   RDW 92/80/7974  13.7  11.5 - 15.5 % Final   Platelets 06/19/2024 190  150 - 400 K/uL Final   nRBC 06/19/2024 0.0  0.0 - 0.2 % Final   Performed at [facility], [address] [place], [place] [address]   Sodium 06/19/2024 142  135 - 145 mmol/L Final   Potassium 06/19/2024 3.8  3.5 - 5.1 mmol/L Final   Chloride 06/19/2024 106  98 - 111 mmol/L Final   CO2 06/19/2024 26  22 - 32 mmol/L Final   Glucose, Bld 06/19/2024 86  70 - 99 mg/dL Final   Glucose reference range applies only to samples taken after fasting for at least 8 hours.   BUN 06/19/2024 8  6 - 20 mg/dL Final   Creatinine, Ser 06/19/2024 0.98  0.61 - 1.24 mg/dL Final   Calcium 92/80/7974 8.5 (L)  8.9 - 10.3 mg/dL Final   GFR, Estimated 06/19/2024 >60  >60 mL/min Final   Comment: (NOTE) Calculated using the CKD-EPI Creatinine Equation (2021)    Anion gap 06/19/2024 10  5 - 15 Final   Performed at [facility], [address], [place], [place] [address]   WBC 06/21/2024 8.3  4.0 - 10.5 K/uL Final   RBC 06/21/2024 3.92 (L)  4.22 - 5.81 MIL/uL Final   Hemoglobin 06/21/2024 12.2 (L)  13.0 - 17.0 g/dL Final   HCT 92/78/7974 35.6 (L)  39.0 - 52.0 % Final   MCV 06/21/2024 90.8  80.0 - 100.0 fL Final   MCH 06/21/2024 31.1  26.0 - 34.0 pg Final   MCHC 06/21/2024 34.3  30.0 - 36.0 g/dL Final   RDW 92/78/7974 12.8  11.5 - 15.5 % Final   Platelets 06/21/2024 165  150 - 400 K/uL Final   nRBC 06/21/2024 0.0  0.0 - 0.2 % Final   Performed at [facility], [address], [place], [place] [address]   Sodium 06/21/2024 135  135 - 145 mmol/L Final   Potassium 06/21/2024 3.4 (L)  3.5 - 5.1 mmol/L Final   Chloride 06/21/2024 101  98 - 111 mmol/L Final   CO2 06/21/2024 23  22 - 32 mmol/L Final   Glucose, Bld 06/21/2024 98  70 - 99 mg/dL Final   Glucose reference range applies only  to samples taken after fasting for at least 8 hours.   BUN 06/21/2024 8  6 - 20 mg/dL Final   Creatinine, Ser 06/21/2024 0.85  0.61 - 1.24 mg/dL Final   Calcium 92/78/7974 8.5 (L)  8.9 - 10.3 mg/dL Final   GFR, Estimated 06/21/2024 >60  >60 mL/min Final   Comment: (NOTE) Calculated using the CKD-EPI Creatinine Equation (2021)    Anion gap 06/21/2024 11  5 - 15 Final   Performed at [facility], [address] [place], [place] [address]   Magnesium  06/21/2024 2.0  1.7 - 2.4 mg/dL Final   Performed at [facility], [address], [place], [place] [address]    Blood Alcohol  level:  Lab Results  Component Value Date   [facility] <15 09/09/2024   ETH 283 (H) 06/18/2024    Metabolic Disorder Labs: Lab Results  Component Value Date   HGBA1C 4.7 (L) 09/09/2024   MPG 88.19 09/09/2024   MPG 103 06/12/2023   No results found for: PROLACTIN Lab Results  Component Value Date   CHOL 237 (H) 09/09/2024   TRIG 99 09/09/2024   HDL 107 09/09/2024   CHOLHDL 2.2 09/09/2024  VLDL 20 09/09/2024   LDLCALC 110 (H) 09/09/2024   LDLCALC 90 06/12/2023    Therapeutic Lab Levels: No results found for: LITHIUM No results found for: VALPROATE No results found for: CBMZ  Physical Findings   CAGE-AID    Flowsheet Row ED to Hosp-Admission (Discharged) from 06/18/2024 in [facility]  CAGE-AID Score 0   GAD-7    Flowsheet Row Office Visit from 07/15/2024 in [facility]  Total GAD-7 Score 0   PHQ2-9    Flowsheet Row ED from 09/09/2024 in [facility] Most recent reading at 09/12/2024 11:45 AM Office Visit from 07/15/2024 in [facility] at [facility] Most recent reading at 07/15/2024 10:13 AM ED from 06/12/2023 in [facility] Most recent reading at 06/16/2023 11:37 AM ED from 06/12/2023 in [facility] Most recent reading at  06/12/2023  7:39 AM  PHQ-2 Total Score 1 0 0 0   Flowsheet Row ED from 09/09/2024 in [facility] Most recent reading at 09/09/2024 11:58 PM ED from 09/09/2024 in [facility] Most recent reading at 09/09/2024  6:52 PM ED to Hosp-Admission (Discharged) from 06/18/2024 in [facility] Most recent reading at 06/18/2024  8:00 PM  C-SSRS RISK CATEGORY No Risk No Risk No Risk     Musculoskeletal  Strength & Muscle Tone: within normal limits Gait & Station: normal Patient leans: N/A  Psychiatric Specialty Exam  Presentation  General Appearance:  Appropriate for Environment  Eye Contact: Good  Speech: Clear and Coherent  Speech Volume: Normal  Handedness: Right   Mood and Affect  Mood: Depressed; Anxious  Affect: Congruent   Thought Process  Thought Processes: Coherent  Descriptions of Associations:Intact  Orientation:Full (Time, Place and Person)  Thought Content:Logical  Diagnosis of Schizophrenia or Schizoaffective disorder in past: No    Hallucinations:Hallucinations: None  Ideas of Reference:None  Suicidal Thoughts:Suicidal Thoughts: No  Homicidal Thoughts:Homicidal Thoughts: No   Sensorium  Memory: Recent Good; Immediate Good  Judgment: Good  Insight: Fair   Art therapist  Concentration: Good  Attention Span: Fair  Recall: Good  Fund of Knowledge: Good  Language: Fair   Psychomotor Activity  Psychomotor Activity: Psychomotor Activity: Normal   Assets  Assets: Desire for Improvement   Sleep  Sleep: Sleep: Fair  Estimated Sleeping Duration (Last 24 Hours): 7.50-9.00 hours  Nutritional Assessment (For OBS and FBC admissions only) Has the patient had a weight loss or gain of 10 pounds or more in the last 3 months?: No Has the patient had a decrease in food intake/or appetite?: No Does the patient have dental problems?: No Does the  patient have eating habits or behaviors that may be indicators of an eating disorder including binging or inducing vomiting?: No Has the patient recently lost weight without trying?: 0 Has the patient been eating poorly because of a decreased appetite?: 0 Malnutrition Screening Tool Score: 0    Physical Exam  Physical Exam Vitals and nursing note reviewed.  Neurological:     Mental Status: He is alert and oriented to person, place, and time.    ROS Blood pressure (!) 142/84, pulse 67, temperature 97.8 F (36.6 C), temperature source Oral, resp. rate 17, SpO2 98%. There is no height or weight on file to calculate BMI.  Treatment Plan Summary: Daily contact with patient to assess and evaluate symptoms and progress in  treatment and Medication management  Continue with current treatment plan on 09/12/2024 as listed below except were noted  Substance induced mood disorder: Alcohol  abuse/ dependency:  Continue gabapentin  100 mg p.o. 3 times daily Continue CIWA/Ativan  protocol Continue trazodone  50 mg p.o. nightly as needed - Nicotine  patch adjusted to nicotine  gum - Follow-up with initiating Vivitrol , disulfiram and/or Campral for alcohol  cravings  CSW to continue working on discharge disposition Patient encouraged to participate in therapeutic milieu   Staci LOISE Kerns, NP 09/12/2024 12:08 PM

## 2024-09-12 NOTE — Progress Notes (Signed)
 Meal given

## 2024-09-13 NOTE — Care Management (Signed)
 [facility] Care Management  Writer reached out to [facility] and [facility]. Patient was declined at both location did not meet criteria.  Writer spoke with patient. Writer encouraged patient to use provider list that was provided and make calls to arrange OPT     10/10/20225 Patient was provided with list of OPT providers.

## 2024-09-13 NOTE — ED Notes (Signed)
 Paitent had breakfast.

## 2024-09-13 NOTE — Group Note (Signed)
 Group Topic: Relapse and Recovery  Group Date: 09/13/2024 Start Time: 1740 End Time: 1800 Facilitators: Carletha Iha, RN  Department: [facility]  Number of Participants: 7  Group Focus: personal responsibility Treatment Modality:  Psychoeducation Interventions utilized were patient education Purpose: express feelings  Name: Timothy Estrada Date of Birth: July 06, 1971  MR: [id number]    Level of Participation: active Quality of Participation: attentive Interactions with others: gave feedback Mood/Affect: appropriate Triggers (if applicable):  Cognition: coherent/clear Progress: Gaining insight Response:  Plan: patient will be encouraged to practice relaxation skills after discharge  Patients Problems:  Patient Active Problem List   Diagnosis Date Noted   Alcohol  use disorder 09/09/2024   Fall 06/19/2024   Alcohol  use disorder, severe, dependence (HCC) 06/12/2023

## 2024-09-13 NOTE — ED Notes (Signed)
 Pt observed lying in bed. Eyes closed respirations even and non labored. NAD q 15 minute observations continue for safety.

## 2024-09-13 NOTE — ED Notes (Signed)
 Pt has been calm and cooperative.   Observable in dayroom.  Behaviors appropriate.   Pt denied current ETOH withdrawal symptoms and reports feeling better. Pt has been observed attending groups. Denied current SI plan and intent.  Denied HI and A/V hallucinations Q 15 minute observations for safety continue

## 2024-09-13 NOTE — ED Notes (Signed)
 Pt is in bed composed his room, he report not being able to sleep, and has a lot on his mind. Writer suggesting reading to pt.  No cute distress noted. Q15 safety checks in place.

## 2024-09-13 NOTE — ED Provider Notes (Signed)
 Behavioral Health Progress Note  Date and Time: 09/13/2024 3:03 PM Name: Timothy Estrada MRN:  [id number]  Subjective:  I'm thinking of starting naltrexone   Timothy Estrada, is seen face to face by this provider, consulted with Dr. Lawrnce; and chart reviewed on 09/13/24.  On evaluation Timothy Estrada reports that he is considering starting Naltrexone  to help manage his alcohol  use disorder; however, he understands that he must be alcohol -free for approximately 7-14 days prior to initiating treatment. He also expresses interest in participating in the substance use IOP upstairs. He reports fracturing his pelvis about two months ago and drinking heavily to manage the pain. He states that he currently drinks about one pint of vodka daily. He denies taking any current medications.  He reports sleeping well overnight and having a good appetite. He expresses a desire to complete treatment, engage in positive activities, and avoid people, places, and things associated with substance use.  He denies suicidal or homicidal ideation, as well as auditory or visual hallucinations. He reports no withdrawal symptoms and states that he is feeling better.  He says Alcohol  is his drug of choice, though he also reports occasional use of THC (approximately one blunt every now and then), stating that he can stop using [facility] whenever he chooses.  During evaluation Timothy Estrada is sitting upright position in no acute distress.  He is alert & oriented x 4, calm, cooperative and attentive for this assessment. His mood is euthymic with congruent affect.  He has normal speech, and behavior.  Objectively there is no evidence of psychosis/mania or delusional thinking. Pt does not appear to be responding to internal or external stimuli.  Patient is able to converse coherently, goal directed thoughts, no distractibility, or pre-occupation.  He also denies suicidal/self-harm/homicidal ideation, psychosis, and paranoia.  Patient answered  question appropriately.     Diagnosis:  Final diagnoses:  Alcohol  use disorder, severe, dependence (HCC)  GAD (generalized anxiety disorder)    Total Time spent with patient: 30 minutes  Past Psychiatric History: Non-reported; Substance use Hx: Alcohol  use disorder.  Past Medical History: Pelvis fracture about 2 months ago Family History: Non-reported.  Family Psychiatric  History: Non-reported.  Social History: He lives between his parents and sister house. 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   Additional Social History: n/a                        Sleep: Good  Appetite:  Good  Current Medications:  Current Facility-Administered Medications  Medication Dose Route Frequency Provider Last Rate Last Admin   acetaminophen  (TYLENOL ) tablet 650 mg  650 mg Oral Q6H PRN Brent, Amanda C, NP   650 mg at 09/12/24 2129   alum & mag hydroxide-simeth (MAALOX/MYLANTA) 200-200-20 MG/5ML suspension 30 mL  30 mL Oral Q4H PRN Brent, Amanda C, NP       cloNIDine (CATAPRES) tablet 0.1 mg  0.1 mg Oral Q8H PRN White, Patrice L, NP   0.1 mg at 09/11/24 2109   haloperidol (HALDOL) tablet 5 mg  5 mg Oral TID PRN Brent, Amanda C, NP       And   diphenhydrAMINE (BENADRYL) capsule 50 mg  50 mg Oral TID PRN Brent, Amanda C, NP       haloperidol lactate (HALDOL) injection 5 mg  5 mg Intramuscular TID PRN Brent, Amanda C, NP       And   diphenhydrAMINE (BENADRYL) injection  50 mg  50 mg Intramuscular TID PRN Brent, Amanda C, NP       And   LORazepam  (ATIVAN ) injection 2 mg  2 mg Intramuscular TID PRN Brent, Amanda C, NP       haloperidol lactate (HALDOL) injection 10 mg  10 mg Intramuscular TID PRN Brent, Amanda C, NP       And   diphenhydrAMINE (BENADRYL) injection 50 mg  50 mg Intramuscular TID PRN Brent, Amanda C, NP       And   LORazepam  (ATIVAN ) injection 2 mg  2 mg Intramuscular TID PRN Brent, Amanda C, NP       gabapentin  (NEURONTIN ) capsule 100 mg  100 mg Oral TID  [name], NP   100 mg at 09/13/24 9149   hydrOXYzine  (ATARAX ) tablet 25 mg  25 mg Oral TID PRN Brent, Amanda C, NP       ibuprofen  (ADVIL ) tablet 600 mg  600 mg Oral Q8H PRN White, Patrice L, NP   600 mg at 09/11/24 2108   [START ON 09/14/2024] LORazepam  (ATIVAN ) tablet 1 mg  1 mg Oral Daily Brent, Amanda C, NP       magnesium  hydroxide (MILK OF MAGNESIA) suspension 30 mL  30 mL Oral Daily PRN Brent, Amanda C, NP       multivitamin with minerals tablet 1 tablet  1 tablet Oral Daily Brent, Amanda C, NP   1 tablet at 09/13/24 9149   nicotine  (NICODERM CQ  - dosed in mg/24 hours) patch 21 mg  21 mg Transdermal Daily White, Patrice L, NP   21 mg at 09/12/24 9081   nicotine  polacrilex (NICORETTE ) gum 2 mg  2 mg Oral PRN Nkwenti, Doris, NP   2 mg at 09/13/24 1459   thiamine  (VITAMIN B1) tablet 100 mg  100 mg Oral Daily Brent, Amanda C, NP   100 mg at 09/13/24 9149   traZODone  (DESYREL ) tablet 50 mg  50 mg Oral QHS PRN Bobbitt, Shalon E, NP   50 mg at 09/12/24 2131   Current Outpatient Medications  Medication Sig Dispense Refill   acetaminophen  (TYLENOL ) 500 MG tablet Take 2 tablets (1,000 mg total) by mouth every 6 (six) hours as needed. (Patient taking differently: Take 1,000-1,500 mg by mouth every 6 (six) hours as needed (For pain).)      Labs  Lab Results:  Admission on 09/09/2024, Discharged on 09/09/2024  Component Date Value Ref Range Status   WBC 09/09/2024 8.9  4.0 - 10.5 K/uL Final   RBC 09/09/2024 4.89  4.22 - 5.81 MIL/uL Final   Hemoglobin 09/09/2024 15.1  13.0 - 17.0 g/dL Final   HCT 89/90/7974 45.7  39.0 - 52.0 % Final   MCV 09/09/2024 93.5  80.0 - 100.0 fL Final   MCH 09/09/2024 30.9  26.0 - 34.0 pg Final   MCHC 09/09/2024 33.0  30.0 - 36.0 g/dL Final   RDW 89/90/7974 13.2  11.5 - 15.5 % Final   Platelets 09/09/2024 356  150 - 400 K/uL Final   nRBC 09/09/2024 0.0  0.0 - 0.2 % Final   Neutrophils Relative % 09/09/2024 68  % Final   Neutro Abs 09/09/2024 6.2  1.7 - 7.7 K/uL  Final   Lymphocytes Relative 09/09/2024 20  % Final   Lymphs Abs 09/09/2024 1.7  0.7 - 4.0 K/uL Final   Monocytes Relative 09/09/2024 10  % Final   Monocytes Absolute 09/09/2024 0.9  0.1 - 1.0 K/uL Final   Eosinophils Relative 09/09/2024 1  %  Final   Eosinophils Absolute 09/09/2024 0.1  0.0 - 0.5 K/uL Final   Basophils Relative 09/09/2024 1  % Final   Basophils Absolute 09/09/2024 0.1  0.0 - 0.1 K/uL Final   Immature Granulocytes 09/09/2024 0  % Final   Abs Immature Granulocytes 09/09/2024 0.02  0.00 - 0.07 K/uL Final   Performed at [facility], [address], [place], [place] [address]   Sodium 09/09/2024 136  135 - 145 mmol/L Final   Potassium 09/09/2024 4.9  3.5 - 5.1 mmol/L Final   Chloride 09/09/2024 98  98 - 111 mmol/L Final   CO2 09/09/2024 27  22 - 32 mmol/L Final   Glucose, Bld 09/09/2024 91  70 - 99 mg/dL Final   Glucose reference range applies only to samples taken after fasting for at least 8 hours.   BUN 09/09/2024 11  6 - 20 mg/dL Final   Creatinine, Ser 09/09/2024 0.93  0.61 - 1.24 mg/dL Final   Calcium 89/90/7974 9.7  8.9 - 10.3 mg/dL Final   Total Protein 89/90/7974 7.7  6.5 - 8.1 g/dL Final   Albumin 89/90/7974 4.4  3.5 - 5.0 g/dL Final   AST 89/90/7974 28  15 - 41 U/L Final   ALT 09/09/2024 28  0 - 44 U/L Final   Alkaline Phosphatase 09/09/2024 105  38 - 126 U/L Final   Total Bilirubin 09/09/2024 1.4 (H)  0.0 - 1.2 mg/dL Final   GFR, Estimated 09/09/2024 >60  >60 mL/min Final   Comment: (NOTE) Calculated using the CKD-EPI Creatinine Equation (2021)    Anion gap 09/09/2024 11  5 - 15 Final   Performed at [facility], [address] [place], [place] [address]   Hgb A1c MFr Bld 09/09/2024 4.7 (L)  4.8 - 5.6 % Final   Comment: (NOTE) Diagnosis of Diabetes The following HbA1c ranges recommended by the American Diabetes Association (ADA) may be used as an aid in the diagnosis of diabetes mellitus.  Hemoglobin             Suggested A1C NGSP%               Diagnosis  <5.7                   Non Diabetic  5.7-6.4                Pre-Diabetic  >6.4                   Diabetic  <7.0                   Glycemic control for                       adults with diabetes.     Mean Plasma Glucose 09/09/2024 88.19  mg/dL Final   Performed at [facility] Lab, [address], [place], [place] [address]   Magnesium  09/09/2024 2.3  1.7 - 2.4 mg/dL Final   Performed at [facility] Lab, [address], [place], [place] [address]   Alcohol , Ethyl (B) 09/09/2024 <15  <15 mg/dL Final   Comment: (NOTE) For medical purposes only. Performed at [facility], [address] [place], [place] [address]    Cholesterol 09/09/2024 237 (H)  0 - 200 mg/dL Final   Triglycerides 89/90/7974 99  <150 mg/dL Final   HDL 89/90/7974 107  >40 mg/dL Final  Total CHOL/HDL Ratio 09/09/2024 2.2  RATIO Final   VLDL 09/09/2024 20  0 - 40 mg/dL Final   LDL Cholesterol 09/09/2024 110 (H)  0 - 99 mg/dL Final   Comment:        Total Cholesterol/HDL:CHD Risk Coronary Heart Disease Risk Table                     Men   Women  1/2 Average Risk   3.4   3.3  Average Risk       5.0   4.4  2 X Average Risk   9.6   7.1  3 X Average Risk  23.4   11.0        Use the calculated Patient Ratio above and the CHD Risk Table to determine the patient's CHD Risk.        ATP III CLASSIFICATION (LDL):  <100     mg/dL   Optimal  899-870  mg/dL   Near or Above                    Optimal  130-159  mg/dL   Borderline  839-810  mg/dL   High  >809     mg/dL   Very High Performed at [facility], [address] [place], [place] [address]    POC Amphetamine UR 09/09/2024 None Detected  NONE DETECTED (Cut Off Level 1000 ng/mL) Final   POC Secobarbital (BAR) 09/09/2024 None Detected  NONE DETECTED (Cut Off Level 300 ng/mL) Final   POC Buprenorphine (BUP) 09/09/2024 None Detected  NONE DETECTED (Cut Off Level 10 ng/mL) Final   POC Oxazepam (BZO) 09/09/2024 None Detected  NONE  DETECTED (Cut Off Level 300 ng/mL) Final   POC Cocaine UR 09/09/2024 None Detected  NONE DETECTED (Cut Off Level 300 ng/mL) Final   POC Methamphetamine UR 09/09/2024 None Detected  NONE DETECTED (Cut Off Level 1000 ng/mL) Final   POC Morphine  09/09/2024 None Detected  NONE DETECTED (Cut Off Level 300 ng/mL) Final   POC Methadone UR 09/09/2024 None Detected  NONE DETECTED (Cut Off Level 300 ng/mL) Final   POC Oxycodone  UR 09/09/2024 None Detected  NONE DETECTED (Cut Off Level 100 ng/mL) Final   POC Marijuana UR 09/09/2024 Positive (A)  NONE DETECTED (Cut Off Level 50 ng/mL) Final   TSH 09/09/2024 2.609  0.350 - 4.500 uIU/mL Final   Comment: Performed by a 3rd Generation assay with a functional sensitivity of <=0.01 uIU/mL. Performed at [facility], [address] [place], [place] [address]   Admission on 06/18/2024, Discharged on 06/21/2024  Component Date Value Ref Range Status   WBC 06/18/2024 11.5 (H)  4.0 - 10.5 K/uL Final   RBC 06/18/2024 4.48  4.22 - 5.81 MIL/uL Final   Hemoglobin 06/18/2024 14.1  13.0 - 17.0 g/dL Final   HCT 92/81/7974 41.9  39.0 - 52.0 % Final   MCV 06/18/2024 93.5  80.0 - 100.0 fL Final   MCH 06/18/2024 31.5  26.0 - 34.0 pg Final   MCHC 06/18/2024 33.7  30.0 - 36.0 g/dL Final   RDW 92/81/7974 13.1  11.5 - 15.5 % Final   Platelets 06/18/2024 256  150 - 400 K/uL Final   nRBC 06/18/2024 0.0  0.0 - 0.2 % Final   Neutrophils Relative % 06/18/2024 64  % Final   Neutro Abs 06/18/2024 7.3  1.7 - 7.7 K/uL Final   Lymphocytes Relative 06/18/2024 25  % Final  Lymphs Abs 06/18/2024 2.9  0.7 - 4.0 K/uL Final   Monocytes Relative 06/18/2024 7  % Final   Monocytes Absolute 06/18/2024 0.8  0.1 - 1.0 K/uL Final   Eosinophils Relative 06/18/2024 1  % Final   Eosinophils Absolute 06/18/2024 0.2  0.0 - 0.5 K/uL Final   Basophils Relative 06/18/2024 1  % Final   Basophils Absolute 06/18/2024 0.1  0.0 - 0.1 K/uL Final   Immature Granulocytes 06/18/2024 2  % Final   Abs  Immature Granulocytes 06/18/2024 0.22 (H)  0.00 - 0.07 K/uL Final   Performed at [facility], [address], [place], [place] [address]   Sodium 06/18/2024 143  135 - 145 mmol/L Final   Potassium 06/18/2024 3.5  3.5 - 5.1 mmol/L Final   Chloride 06/18/2024 107  98 - 111 mmol/L Final   CO2 06/18/2024 23  22 - 32 mmol/L Final   Glucose, Bld 06/18/2024 95  70 - 99 mg/dL Final   Glucose reference range applies only to samples taken after fasting for at least 8 hours.   BUN 06/18/2024 9  6 - 20 mg/dL Final   Creatinine, Ser 06/18/2024 0.86  0.61 - 1.24 mg/dL Final   Calcium 92/81/7974 8.6 (L)  8.9 - 10.3 mg/dL Final   Total Protein 92/81/7974 6.4 (L)  6.5 - 8.1 g/dL Final   Albumin 92/81/7974 3.9  3.5 - 5.0 g/dL Final   AST 92/81/7974 99 (H)  15 - 41 U/L Final   ALT 06/18/2024 82 (H)  0 - 44 U/L Final   Alkaline Phosphatase 06/18/2024 62  38 - 126 U/L Final   Total Bilirubin 06/18/2024 0.9  0.0 - 1.2 mg/dL Final   GFR, Estimated 06/18/2024 >60  >60 mL/min Final   Comment: (NOTE) Calculated using the CKD-EPI Creatinine Equation (2021)    Anion gap 06/18/2024 13  5 - 15 Final   Performed at [facility], [address], [place], [place] [address]   Sodium 06/18/2024 143  135 - 145 mmol/L Final   Potassium 06/18/2024 3.4 (L)  3.5 - 5.1 mmol/L Final   Chloride 06/18/2024 107  98 - 111 mmol/L Final   BUN 06/18/2024 10  6 - 20 mg/dL Final   Creatinine, Ser 06/18/2024 1.30 (H)  0.61 - 1.24 mg/dL Final   Glucose, Bld 92/81/7974 92  70 - 99 mg/dL Final   Glucose reference range applies only to samples taken after fasting for at least 8 hours.   Calcium, Ion 06/18/2024 1.03 (L)  1.15 - 1.40 mmol/L Final   TCO2 06/18/2024 23  22 - 32 mmol/L Final   Hemoglobin 06/18/2024 14.6  13.0 - 17.0 g/dL Final   HCT 92/81/7974 43.0  39.0 - 52.0 % Final   Lactic Acid, Venous 06/18/2024 2.3 (HH)  0.5 - 1.9 mmol/L Final   Comment 06/18/2024 NOTIFIED PHYSICIAN   Final   Alcohol , Ethyl (B)  06/18/2024 283 (H)  <15 mg/dL Final   Comment: (NOTE) For medical purposes only. Performed at [facility], [address] [place], [place] [address]    HIV Screen 4th Generation wRfx 06/19/2024 Non Reactive  Non Reactive Final   Performed at [facility], [address], [place], [place] [address]   WBC 06/19/2024 9.6  4.0 - 10.5 K/uL Final   RBC 06/19/2024 3.81 (L)  4.22 - 5.81 MIL/uL Final   Hemoglobin 06/19/2024 12.2 (L)  13.0 - 17.0 g/dL Final   HCT 92/80/7974 36.1 (  L)  39.0 - 52.0 % Final   MCV 06/19/2024 94.8  80.0 - 100.0 fL Final   MCH 06/19/2024 32.0  26.0 - 34.0 pg Final   MCHC 06/19/2024 33.8  30.0 - 36.0 g/dL Final   RDW 92/80/7974 13.7  11.5 - 15.5 % Final   Platelets 06/19/2024 190  150 - 400 K/uL Final   nRBC 06/19/2024 0.0  0.0 - 0.2 % Final   Performed at [facility], [address], [place], [place] [address]   Sodium 06/19/2024 142  135 - 145 mmol/L Final   Potassium 06/19/2024 3.8  3.5 - 5.1 mmol/L Final   Chloride 06/19/2024 106  98 - 111 mmol/L Final   CO2 06/19/2024 26  22 - 32 mmol/L Final   Glucose, Bld 06/19/2024 86  70 - 99 mg/dL Final   Glucose reference range applies only to samples taken after fasting for at least 8 hours.   BUN 06/19/2024 8  6 - 20 mg/dL Final   Creatinine, Ser 06/19/2024 0.98  0.61 - 1.24 mg/dL Final   Calcium 92/80/7974 8.5 (L)  8.9 - 10.3 mg/dL Final   GFR, Estimated 06/19/2024 >60  >60 mL/min Final   Comment: (NOTE) Calculated using the CKD-EPI Creatinine Equation (2021)    Anion gap 06/19/2024 10  5 - 15 Final   Performed at [facility], [address] [place], [place] [address]   WBC 06/21/2024 8.3  4.0 - 10.5 K/uL Final   RBC 06/21/2024 3.92 (L)  4.22 - 5.81 MIL/uL Final   Hemoglobin 06/21/2024 12.2 (L)  13.0 - 17.0 g/dL Final   HCT 92/78/7974 35.6 (L)  39.0 - 52.0 % Final   MCV 06/21/2024 90.8  80.0 - 100.0 fL Final   MCH 06/21/2024 31.1  26.0 - 34.0 pg Final   MCHC 06/21/2024 34.3  30.0 -  36.0 g/dL Final   RDW 92/78/7974 12.8  11.5 - 15.5 % Final   Platelets 06/21/2024 165  150 - 400 K/uL Final   nRBC 06/21/2024 0.0  0.0 - 0.2 % Final   Performed at [facility], [address] [place], [place] [address]   Sodium 06/21/2024 135  135 - 145 mmol/L Final   Potassium 06/21/2024 3.4 (L)  3.5 - 5.1 mmol/L Final   Chloride 06/21/2024 101  98 - 111 mmol/L Final   CO2 06/21/2024 23  22 - 32 mmol/L Final   Glucose, Bld 06/21/2024 98  70 - 99 mg/dL Final   Glucose reference range applies only to samples taken after fasting for at least 8 hours.   BUN 06/21/2024 8  6 - 20 mg/dL Final   Creatinine, Ser 06/21/2024 0.85  0.61 - 1.24 mg/dL Final   Calcium 92/78/7974 8.5 (L)  8.9 - 10.3 mg/dL Final   GFR, Estimated 06/21/2024 >60  >60 mL/min Final   Comment: (NOTE) Calculated using the CKD-EPI Creatinine Equation (2021)    Anion gap 06/21/2024 11  5 - 15 Final   Performed at [facility], [address] [place], [place] [address]   Magnesium  06/21/2024 2.0  1.7 - 2.4 mg/dL Final   Performed at [facility], [address], [place], [place] [address]    Blood Alcohol  level:  Lab Results  Component Value Date   [facility] <15 09/09/2024   ETH 283 (H) 06/18/2024    Metabolic Disorder Labs: Lab Results  Component Value Date   HGBA1C 4.7 (L) 09/09/2024  MPG 88.19 09/09/2024   MPG 103 06/12/2023   No results found for: PROLACTIN Lab Results  Component Value Date   CHOL 237 (H) 09/09/2024   TRIG 99 09/09/2024   HDL 107 09/09/2024   CHOLHDL 2.2 09/09/2024   VLDL 20 09/09/2024   LDLCALC 110 (H) 09/09/2024   LDLCALC 90 06/12/2023    Therapeutic Lab Levels: No results found for: LITHIUM No results found for: VALPROATE No results found for: CBMZ  Physical Findings   CAGE-AID    Flowsheet Row ED to Hosp-Admission (Discharged) from 06/18/2024 in [place] [facility]  CAGE-AID Score 0   GAD-7    Flowsheet Row Office Visit from  07/15/2024 in [facility] at [facility]  Total GAD-7 Score 0   PHQ2-9    Flowsheet Row ED from 09/09/2024 in [facility] Most recent reading at 09/13/2024  3:02 PM Office Visit from 07/15/2024 in [facility] at [facility] Most recent reading at 07/15/2024 10:13 AM ED from 06/12/2023 in [facility] Most recent reading at 06/16/2023 11:37 AM ED from 06/12/2023 in [facility] Most recent reading at 06/12/2023  7:39 AM  PHQ-2 Total Score 0 0 0 0   Flowsheet Row ED from 09/09/2024 in [facility] Most recent reading at 09/09/2024 11:58 PM ED from 09/09/2024 in [facility] Most recent reading at 09/09/2024  6:52 PM ED to Hosp-Admission (Discharged) from 06/18/2024 in [place] [facility] Most recent reading at 06/18/2024  8:00 PM  C-SSRS RISK CATEGORY No Risk No Risk No Risk     Musculoskeletal  Strength & Muscle Tone: within normal limits Gait & Station: normal Patient leans: N/A  Psychiatric Specialty Exam  Presentation  General Appearance:  Appropriate for Environment  Eye Contact: Good  Speech: Clear and Coherent; Normal Rate  Speech Volume: Normal  Handedness: Right   Mood and Affect  Mood: Euthymic  Affect: Congruent   Thought Process  Thought Processes: Coherent; Goal Directed  Descriptions of Associations:Intact  Orientation:Full (Time, Place and Person)  Thought Content:WDL  Diagnosis of Schizophrenia or Schizoaffective disorder in past: No    Hallucinations:Hallucinations: None  Ideas of Reference:None  Suicidal Thoughts:Suicidal Thoughts: No  Homicidal Thoughts:Homicidal Thoughts: No   Sensorium  Memory: Immediate Fair; Recent Fair  Judgment: Fair  Insight: Fair   Art therapist  Concentration: Fair  Attention Span: Fair  Recall: Good  Fund  of Knowledge: Fair  Language: Good   Psychomotor Activity  Psychomotor Activity: Psychomotor Activity: Normal   Assets  Assets: Communication Skills; Desire for Improvement   Sleep  Sleep: Sleep: Good  Estimated Sleeping Duration (Last 24 Hours): 9.75-10.25 hours  Nutritional Assessment (For OBS and FBC admissions only) Has the patient had a weight loss or gain of 10 pounds or more in the last 3 months?: No Has the patient had a decrease in food intake/or appetite?: No Does the patient have dental problems?: No Does the patient have eating habits or behaviors that may be indicators of an eating disorder including binging or inducing vomiting?: No Has the patient recently lost weight without trying?: 0 Has the patient been eating poorly because of a decreased appetite?: 0 Malnutrition Screening Tool Score: 0    Physical Exam  Physical Exam Vitals reviewed.  Constitutional:      Appearance: He is normal weight.  HENT:     Head: Normocephalic and  atraumatic.     Nose: Nose normal.     Mouth/Throat:     Pharynx: Oropharynx is clear.  Cardiovascular:     Rate and Rhythm: Normal rate.  Pulmonary:     Effort: Pulmonary effort is normal.  Musculoskeletal:        General: Normal range of motion.     Cervical back: Normal range of motion.  Skin:    General: Skin is dry.  Neurological:     Mental Status: He is alert and oriented to person, place, and time.  Psychiatric:        Attention and Perception: Attention and perception normal.        Mood and Affect: Mood and affect normal.        Speech: Speech normal.        Behavior: Behavior normal. Behavior is cooperative.        Thought Content: Thought content normal.        Cognition and Memory: Cognition and memory normal.        Judgment: Judgment normal.    Review of Systems  Psychiatric/Behavioral:  Positive for substance abuse.   All other systems reviewed and are negative.  Blood pressure 130/88, pulse  72, temperature 97.7 F (36.5 C), temperature source Oral, resp. rate 18, SpO2 100%. There is no height or weight on file to calculate BMI.  Treatment Plan Summary: Daily contact with patient to assess and evaluate symptoms and progress in treatment, Medication management, and plan.  Plan is to discharge on 10/15 to self as he has been declined by [facility] due to not meeting criteria per case management note.   Tosin Ronne Stefanski, NP 09/13/2024 3:03 PM

## 2024-09-13 NOTE — Group Note (Signed)
 Group Topic: Wellness  Group Date: 09/13/2024 Start Time: 2000 End Time: 2030 Facilitators: Anice Benton LABOR, NT  Department: [facility]  Number of Participants: 4  Group Focus: check in and social skills Treatment Modality:  Individual Therapy Interventions utilized were support Purpose: express feelings  Name: Timothy Estrada Date of Birth: [birth year]  MR: [id number]    Level of Participation: active Quality of Participation: attentive and cooperative Interactions with others: gave feedback Mood/Affect: appropriate Triggers (if applicable): N/A Cognition: coherent/clear Progress: Moderate Response: Good Plan: follow-up needed  Patients Problems:  Patient Active Problem List   Diagnosis Date Noted   Alcohol  use disorder 09/09/2024   Fall 06/19/2024   Alcohol  use disorder, severe, dependence (HCC) 06/12/2023

## 2024-09-13 NOTE — ED Notes (Signed)
 Patient A&Ox4. Has been calm, cooperative, and appropriate with peers. He denies intent to harm self/others. Patient denies any physical pain or discomfort. No acute distress observed or acute alcohol  withdrawal noted. Routine safety checks conducted according to facility protocol. Patient agreed to notify staff should thoughts of harm toward self or others arise. We will continue to monitor for safety.

## 2024-09-13 NOTE — ED Notes (Signed)
 Paitent had dinner.

## 2024-09-14 MED ORDER — POLYVINYL ALCOHOL 1.4 % OP SOLN: 2.0000 [drp] | OPHTHALMIC | 0 refills | Status: DC | PRN

## 2024-09-14 MED ORDER — GABAPENTIN 100 MG PO CAPS: 100.0000 mg | ORAL_CAPSULE | Freq: Three times a day (TID) | ORAL | 0 refills | Status: AC

## 2024-09-14 MED ORDER — ADULT MULTIVITAMIN W/MINERALS CH: 1.0000 | ORAL_TABLET | Freq: Every day | ORAL | 0 refills | Status: DC

## 2024-09-14 MED ORDER — POLYVINYL ALCOHOL 1.4 % OP SOLN: 2.0000 [drp] | OPHTHALMIC | Status: DC | PRN

## 2024-09-14 NOTE — ED Notes (Signed)
 Patient alert & oriented x4. Denies intent to harm self or others when asked. Denies A/VH. Patient denies any physical complaints when asked. No acute distress noted. PRN nicorette  provided due to nicotine  cravings. Support and encouragement provided. Patient observed in milieu. No inappropriate behaviors observed or reported. Routine safety checks conducted per facility protocol. Encouraged patient to notify staff if any thoughts of harm towards self or others arise. Patient verbalizes understanding and agreement.

## 2024-09-14 NOTE — Discharge Instructions (Addendum)
 Hi Mr. Jowett, it was a pleasure working with you. Please don't forget your appointment with [facility] at 08:30 on 09/15/24. Continue taking all your medications as prescribed following discharge from the hospital. Follow up with your outpatient providers for your overall health.  Get help right away if: You have thoughts about hurting yourself or others. Get help right away if you feel like you may hurt yourself or others, or have thoughts about taking your own life. Go to your nearest emergency room or: Call 911. Call the National Suicide Prevention Lifeline at (313)703-0874 or 988 in the [place] This is open 24 hours a day. If you're a Veteran: Call 988 and press 1. This is open 24 hours a day. Text the [employer] at (331)797-1783. Summary Mental health is not just the absence of mental illness. It involves understanding your emotions and behaviors, and taking steps to manage them in a healthy way. If you have symptoms of mental or emotional distress, get help from family, friends, a health care provider, or a mental health professional. Practice good mental health behaviors such as stress management skills, self-calming skills, exercise, healthy sleeping and eating, and supportive relationships. This information is not intended to replace advice given to you by your health care provider. Make sure you discuss any questions you have with your health care provider.  Education provided on the fact that if experiencing worsening of psychiatry symptoms including suicidal ideations, homicidal ideations, or having auditory/visual hallucinations, etc, to call 911, 988, come back to this location, or go to the nearest ER. Pt verbalized understanding.

## 2024-09-14 NOTE — Group Note (Signed)
 Group Topic: Healthy Self Image and Positive Change  Group Date: 09/14/2024 Start Time: 2000 End Time: 2030 Facilitators: Anice Benton LABOR, NT  Department: [facility]  Number of Participants: 4  Group Focus: communication, feeling awareness/expression, and relapse prevention Treatment Modality:  Solution-Focused Therapy Interventions utilized were group exercise Purpose: increase insight, express feelings   Name: Timothy Estrada Date of Birth: 01-Apr-1971  MR: [id number]    Level of Participation: active Quality of Participation: attentive, cooperative, and passive Interactions with others: gave feedback Mood/Affect: appropriate and positive Triggers (if applicable): N/A Cognition: coherent/clear Progress: Significant Response: N/A Plan: follow-up needed  Patients Problems:  Patient Active Problem List   Diagnosis Date Noted   Alcohol  use disorder 09/09/2024   Fall 06/19/2024   Alcohol  use disorder, severe, dependence (HCC) 06/12/2023

## 2024-09-14 NOTE — Care Management (Signed)
 [facility] Care Management...  Writer met with patient.  Writer advised patient of discharging on 09/15/24.   ** Patient has an appointment with CD-IOP at 8:30 am **   Patient was advised to bring copy of insurance card so they can obtain authorization for services.   Patient stated that he has insurance card in his belongings

## 2024-09-14 NOTE — Group Note (Signed)
 Group Topic: Social Support  Group Date: 09/14/2024 Start Time: 1600 End Time: 1630 Facilitators: Mahdi Frye, Zane HERO, RN  Department: [facility]  Number of Participants: 1  Group Focus: check in Treatment Modality:  Individual Therapy Interventions utilized were support Purpose: express feelings  Name: Timothy Estrada Date of Birth: May 13, 1971  MR: [id number]    Level of Participation: moderate Quality of Participation: attentive and cooperative Interactions with others: gave feedback Mood/Affect: appropriate Triggers (if applicable): None identified at this time Cognition: coherent/clear, insightful, and logical Progress: Gaining insight Response: Patient voiced no concerns regarding unit at this time. Support provided. Plan: patient will be encouraged to continue to attend groups and programming on the unit  Patients Problems:  Patient Active Problem List   Diagnosis Date Noted   Alcohol  use disorder 09/09/2024   Fall 06/19/2024   Alcohol  use disorder, severe, dependence (HCC) 06/12/2023

## 2024-09-14 NOTE — Group Note (Signed)
 Group Topic: Understanding Self  Group Date: 09/14/2024 Start Time: 1200 End Time: 1215 Facilitators: Veverly Oddis BRAVO, NT  Department: [facility]  Number of Participants: 4  Group Focus: goals/reality orientation Treatment Modality:  Cognitive Behavioral Therapy Interventions utilized were support Purpose: regain self-worth  Name: Timothy Estrada Date of Birth: 03-Mar-1971  MR: [id number]    Level of Participation: active Quality of Participation: cooperative Interactions with others: gave feedback Mood/Affect: positive Triggers (if applicable): none Cognition: coherent/clear Progress: Moderate Response:  I want to stop drinking Plan: patient will be encouraged to surround him self around people with similar goals.  Patients Problems:  Patient Active Problem List   Diagnosis Date Noted   Alcohol  use disorder 09/09/2024   Fall 06/19/2024   Alcohol  use disorder, severe, dependence (HCC) 06/12/2023

## 2024-09-14 NOTE — Care Management (Signed)
 [facility] Care Management...  Patient has an appointment with CD-IOP at 8:30 am **   Patient will discharge from [facility] by 7:300 am in order to gather belongings and arrive for CD-IOP appointment   Patient was advised to bring copy of insurance card so they can obtain authorization for services.    Patient stated that he has insurance card in his belongings    Writer met with the patient and discussed discharge planning.  Patient requests intensive outpatient substance abuse treatment.    Writer coordinated an intake appointment  for the patient on Wednesday, October 15th, at 8:30 a.m at [facility] on the 2nd floor in [place].  The phone number is [phone]

## 2024-09-14 NOTE — ED Notes (Signed)
 Pt

## 2024-09-14 NOTE — ED Notes (Signed)
 PRN nicorette  given due to patient reports of nicotine  cravings. Medication administered with no complications. Environment secured, safety checks in place per facility policy.

## 2024-09-14 NOTE — ED Notes (Signed)
 Paitent had dinner.

## 2024-09-14 NOTE — [facility] Group Notes (Signed)
 Spirituality Group   Description: Participant directed exploration of values, beliefs and meaning   Following a brief framework of chaplain's role and ground rules of group behavior, participants are invited to share concerns or questions that engage spiritual life. Emphasis placed on common themes and shared experiences and ways to make meaning and clarify living into one's values.   Theory/Process/Goal: Utilize the theoretical framework of group therapy established by Celena Kite, Relational Cultural Theory and Rogerian approaches to facilitate relational empathy and use of the "here and now" to foster reflection, self-awareness, and sharing.   Observations: Timothy Estrada was initially reserved but joined in group discussion. Shared positive outlook and goals for recovery.  Timothy Estrada L. Delores HERO.Div

## 2024-09-14 NOTE — ED Provider Notes (Signed)
 [facility]/OBS ASAP Discharge Summary  Date and Time: 09/14/2024 3:57 PM  Name: Timothy Estrada  MRN:  [id number]   Discharge Diagnoses:  Final diagnoses:  Alcohol  use disorder, severe, dependence (HCC)  GAD (generalized anxiety disorder)    Subjective:I'm doing good  Stay Summary: Pt was admitted to [facility] from 10/09-10/15 for Severe alcohol  use disorder.   Timothy Estrada is a 53 year old male patient with a past psychiatric history significant for alcohol  use disorder severe dependence who was admitted to the [facility] on 09/09/2024 for alcohol  use. Patient reports drinking a pint of vodka daily since 2020 and states that he last consumed alcohol  late last night on 09/08/2024. BAL negative on arrival. UDS positive for marijuana.   During the patient's hospitalization, patient had initial psychiatric/substance use evaluation, and follow-up psychiatric/substance use evaluations every day.  Patient's care was discussed during the interdisciplinary team meeting every day during the hospitalization.  Symptoms were reported as significantly decreased or resolved completely by discharge.  Timothy Estrada, is seen face to face by this provider, consulted with Dr. Lawrnce; and chart reviewed on 09/14/24.  On evaluation Timothy Estrada reports that he is doing good, and that he is discharging to [facility] tomorrow with [facility]. He states that he has never participated in any outpatient programs before, and this will be his first experience. When asked if he had any concerns or questions about the program, he stated that he would be fine.  The provider informed him that the meeting are usually very helpful, with most participants actively engaged. Patient reports interest in tobacco cessation. He currently has a nicotine  patch on and stated that he has enough at home and does not need additional patches/scripts at this time.  He has a primary care provider, Sula Leavy Rode which is where  is he is receiving care for his pelvic fracture. He reports his last visit was about 2 months ago.  Patient reports that he is ready for a positive change and believes CDIOP will be beneficial for him, stating that he is getting tired of substance use. He described his mood as "really good," and denies auditory or visual hallucinations, suicidal ideation, or homicidal ideation.   During evaluation Timothy Estrada is sitting in an upright position in no acute distress.  He is alert & oriented x 4, calm, cooperative and attentive for this assessment.  His mood is euthymic with congruent affect. He has normal speech, and behavior.  Objectively there is no evidence of psychosis/mania or delusional thinking. Pt does not appear to be responding to internal or external stimuli.  Patient is able to converse coherently, goal directed thoughts, no distractibility, or pre-occupation.He also denies suicidal/self-harm/homicidal ideation, psychosis, and paranoia.  Patient answered question appropriately.    Total Time spent with patient: 30 minutes  Past Psychiatric History: Non-reported; Substance use Hx: Alcohol  use disorder.  Past Medical History: Pelvis fracture about 2 months ago  Family History: Non-reported.  Family Psychiatric History: Non-reported.  Social History: He lives between his parents and sister house. 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  Tobacco Cessation:  Prescription not provided because he reports that he has adequate medications and does not need a prescription at this time.   Current Medications:  Current Facility-Administered Medications  Medication Dose Route Frequency Provider Last Rate Last Admin   acetaminophen  (TYLENOL ) tablet 650 mg  650 mg Oral Q6H PRN Brent, Amanda C, NP  650 mg at 09/13/24 1649   alum & mag hydroxide-simeth (MAALOX/MYLANTA) 200-200-20 MG/5ML suspension 30 mL  30 mL Oral Q4H PRN Brent, Amanda C, NP       artificial tears ophthalmic solution  2 drop  2 drop Both Eyes PRN Lawrnce, Rhoda J, MD       cloNIDine (CATAPRES) tablet 0.1 mg  0.1 mg Oral Q8H PRN White, Patrice L, NP   0.1 mg at 09/11/24 2109   haloperidol (HALDOL) tablet 5 mg  5 mg Oral TID PRN Brent, Amanda C, NP       And   diphenhydrAMINE (BENADRYL) capsule 50 mg  50 mg Oral TID PRN Brent, Amanda C, NP       haloperidol lactate (HALDOL) injection 5 mg  5 mg Intramuscular TID PRN Brent, Amanda C, NP       And   diphenhydrAMINE (BENADRYL) injection 50 mg  50 mg Intramuscular TID PRN Brent, Amanda C, NP       And   LORazepam  (ATIVAN ) injection 2 mg  2 mg Intramuscular TID PRN Brent, Amanda C, NP       haloperidol lactate (HALDOL) injection 10 mg  10 mg Intramuscular TID PRN Brent, Amanda C, NP       And   diphenhydrAMINE (BENADRYL) injection 50 mg  50 mg Intramuscular TID PRN Brent, Amanda C, NP       And   LORazepam  (ATIVAN ) injection 2 mg  2 mg Intramuscular TID PRN Brent, Amanda C, NP       gabapentin  (NEURONTIN ) capsule 100 mg  100 mg Oral TID [name], NP   100 mg at 09/14/24 1532   hydrOXYzine  (ATARAX ) tablet 25 mg  25 mg Oral TID PRN Brent, Amanda C, NP       ibuprofen  (ADVIL ) tablet 600 mg  600 mg Oral Q8H PRN White, Patrice L, NP   600 mg at 09/13/24 2107   magnesium  hydroxide (MILK OF MAGNESIA) suspension 30 mL  30 mL Oral Daily PRN Brent, Amanda C, NP       multivitamin with minerals tablet 1 tablet  1 tablet Oral Daily Brent, Amanda C, NP   1 tablet at 09/14/24 9061   nicotine  (NICODERM CQ  - dosed in mg/24 hours) patch 21 mg  21 mg Transdermal Daily White, Patrice L, NP   21 mg at 09/14/24 0941   nicotine  polacrilex (NICORETTE ) gum 2 mg  2 mg Oral PRN Timothy Estrada, Doris, NP   2 mg at 09/14/24 9176   thiamine  (VITAMIN B1) tablet 100 mg  100 mg Oral Daily Brent, Amanda C, NP   100 mg at 09/14/24 0940   traZODone  (DESYREL ) tablet 50 mg  50 mg Oral QHS PRN Bobbitt, Shalon E, NP   50 mg at 09/13/24 2106   Current Outpatient Medications  Medication Sig Dispense  Refill   acetaminophen  (TYLENOL ) 500 MG tablet Take 2 tablets (1,000 mg total) by mouth every 6 (six) hours as needed. (Patient taking differently: Take 1,000-1,500 mg by mouth every 6 (six) hours as needed (For pain).)     artificial tears ophthalmic solution Place 2 drops into both eyes as needed for dry eyes. 15 mL 0   gabapentin  (NEURONTIN ) 100 MG capsule Take 1 capsule (100 mg total) by mouth 3 (three) times daily. 45 capsule 0   [START ON 09/15/2024] Multiple Vitamin (MULTIVITAMIN WITH MINERALS) TABS tablet Take 1 tablet by mouth daily. 30 tablet 0    PTA Medications:  Facility Ordered Medications  Medication   [COMPLETED] cloNIDine (CATAPRES) tablet 0.1 mg   [COMPLETED] thiamine  (VITAMIN B1) injection 100 mg   acetaminophen  (TYLENOL ) tablet 650 mg   alum & mag hydroxide-simeth (MAALOX/MYLANTA) 200-200-20 MG/5ML suspension 30 mL   magnesium  hydroxide (MILK OF MAGNESIA) suspension 30 mL   haloperidol (HALDOL) tablet 5 mg   And   diphenhydrAMINE (BENADRYL) capsule 50 mg   haloperidol lactate (HALDOL) injection 5 mg   And   diphenhydrAMINE (BENADRYL) injection 50 mg   And   LORazepam  (ATIVAN ) injection 2 mg   haloperidol lactate (HALDOL) injection 10 mg   And   diphenhydrAMINE (BENADRYL) injection 50 mg   And   LORazepam  (ATIVAN ) injection 2 mg   hydrOXYzine  (ATARAX ) tablet 25 mg   thiamine  (VITAMIN B1) tablet 100 mg   multivitamin with minerals tablet 1 tablet   [EXPIRED] LORazepam  (ATIVAN ) tablet 1 mg   [EXPIRED] hydrOXYzine  (ATARAX ) tablet 25 mg   [EXPIRED] loperamide  (IMODIUM ) capsule 2-4 mg   [EXPIRED] ondansetron  (ZOFRAN -ODT) disintegrating tablet 4 mg   [COMPLETED] LORazepam  (ATIVAN ) tablet 1 mg   Followed by   [COMPLETED] LORazepam  (ATIVAN ) tablet 1 mg   Followed by   [COMPLETED] LORazepam  (ATIVAN ) tablet 1 mg   Followed by   [COMPLETED] LORazepam  (ATIVAN ) tablet 1 mg   nicotine  (NICODERM CQ  - dosed in mg/24 hours) patch 21 mg   cloNIDine (CATAPRES) tablet 0.1 mg    ibuprofen  (ADVIL ) tablet 600 mg   traZODone  (DESYREL ) tablet 50 mg   gabapentin  (NEURONTIN ) capsule 100 mg   nicotine  polacrilex (NICORETTE ) gum 2 mg   artificial tears ophthalmic solution 2 drop   PTA Medications  Medication Sig   acetaminophen  (TYLENOL ) 500 MG tablet Take 2 tablets (1,000 mg total) by mouth every 6 (six) hours as needed. (Patient taking differently: Take 1,000-1,500 mg by mouth every 6 (six) hours as needed (For pain).)   gabapentin  (NEURONTIN ) 100 MG capsule Take 1 capsule (100 mg total) by mouth 3 (three) times daily.   [START ON 09/15/2024] Multiple Vitamin (MULTIVITAMIN WITH MINERALS) TABS tablet Take 1 tablet by mouth daily.   artificial tears ophthalmic solution Place 2 drops into both eyes as needed for dry eyes.       09/14/2024    3:18 PM 09/13/2024    3:02 PM 09/12/2024   11:45 AM  Depression screen PHQ 2/9  Decreased Interest 0 0 1  Down, Depressed, Hopeless 0 0 0  PHQ - 2 Score 0 0 1    Flowsheet Row ED from 09/09/2024 in [facility] Most recent reading at 09/09/2024 11:58 PM ED from 09/09/2024 in [facility] Most recent reading at 09/09/2024  6:52 PM ED to Hosp-Admission (Discharged) from 06/18/2024 in [place] [facility] Most recent reading at 06/18/2024  8:00 PM  C-SSRS RISK CATEGORY No Risk No Risk No Risk    Musculoskeletal  Strength & Muscle Tone: within normal limits Gait & Station: normal Patient leans: N/A  Psychiatric Specialty Exam  Presentation  General Appearance:  Appropriate for Environment  Eye Contact: Good  Speech: Clear and Coherent; Normal Rate  Speech Volume: Normal  Handedness: Right   Mood and Affect  Mood: Euthymic  Affect: Congruent   Thought Process  Thought Processes: Coherent  Descriptions of Associations:Intact  Orientation:Full (Time, Place and Person)  Thought Content:WDL  Diagnosis of Schizophrenia or  Schizoaffective disorder in past: No    Hallucinations:Hallucinations: None  Ideas of Reference:None  Suicidal Thoughts:Suicidal Thoughts:  No  Homicidal Thoughts:Homicidal Thoughts: No   Sensorium  Memory: Immediate Good; Recent Fair  Judgment: Good  Insight: Good   Executive Functions  Concentration: Good  Attention Span: Good  Recall: Fair  Fund of Knowledge: Fair  Language: Good   Psychomotor Activity  Psychomotor Activity: Psychomotor Activity: Normal   Assets  Assets: Communication Skills; Desire for Improvement; Physical Health   Sleep  Sleep: Sleep: Good  Estimated Sleeping Duration (Last 24 Hours): 9.50-10.25 hours  No data recorded  Physical Exam  Physical Exam Vitals reviewed.  Constitutional:      Appearance: Normal appearance. He is normal weight.  HENT:     Head: Normocephalic and atraumatic.     Nose: Nose normal.     Mouth/Throat:     Pharynx: Oropharynx is clear.  Cardiovascular:     Rate and Rhythm: Normal rate.  Pulmonary:     Effort: Pulmonary effort is normal.  Musculoskeletal:        General: Normal range of motion.     Cervical back: Normal range of motion.  Neurological:     Mental Status: He is alert and oriented to person, place, and time.  Psychiatric:        Mood and Affect: Mood normal.        Behavior: Behavior normal.        Thought Content: Thought content normal.        Judgment: Judgment normal.    Review of Systems  Psychiatric/Behavioral:  Positive for substance abuse.   All other systems reviewed and are negative.  Blood pressure 132/60, pulse 70, temperature 98.2 F (36.8 C), temperature source Oral, resp. rate 17, SpO2 99%. There is no height or weight on file to calculate BMI.  Demographic Factors:  Male, Low socioeconomic status, and Unemployed  Loss Factors: NA  Historical Factors: NA  Risk Reduction Factors:   Living with another person, especially a relative, Positive social  support, and Positive coping skills or problem solving skills  Continued Clinical Symptoms:  Alcohol /Substance Abuse/Dependencies  Cognitive Features That Contribute To Risk:  None    Suicide Risk:  Minimal: No identifiable suicidal ideation.  Patients presenting with no risk factors but with morbid ruminations; may be classified as minimal risk based on the severity of the depressive symptoms  Plan Of Care/Follow-up recommendations:  Pt is starting at CD IOP tomorrow at 0830 and he is ready for a change.  Pt currently reports that his mood is stable. He denies having suicidal thoughts, or homicidal thoughts. He denies having auditory or visual hallucinations or other symptoms of psychosis. The patient was motivated to continue taking medication with a goal of continued improvement in mental health. Supportive psychotherapy was provided to the patient. The patient also participated in regular group therapy while hospitalized. Coping skills, problem solving as well as relaxation therapies were also part of the unit programming.  Labs were reviewed with the patient, and abnormal results were discussed with the patient.  The patient is able to verbalize their individual safety plan to this provider. It is recommended to the patient to continue psychiatric medications as prescribed, after discharge from the hospital.   It is recommended to the patient to follow up with your outpatient psychiatric provider and primary care provider regarding his labs and overall health.  It was discussed with the patient, the impact of alcohol , drugs, tobacco have been there overall psychiatric and medical wellbeing, and total abstinence from substance use was recommended the patient.ed. Prescriptions provided  or sent directly to preferred pharmacy at discharge. Patient agreeable to plan. Given opportunity to ask questions. Appears to feel comfortable with discharge.   In the event of worsening symptoms, the patient  is instructed to call the crisis hotline, 911 and or go to the nearest ED for appropriate evaluation and treatment of symptoms. To follow-up with primary care provider for other medical issues, concerns and or health care needs  Discharge medication reconciliation completed and script sent to his pharmacy.   Disposition: CD IOP  Tosin Fitzgerald Dunne, NP 09/14/2024, 3:57 PM

## 2024-09-15 ENCOUNTER — Ambulatory Visit (HOSPITAL_COMMUNITY)

## 2024-09-15 ENCOUNTER — Telehealth (HOSPITAL_COMMUNITY): Payer: Self-pay

## 2024-09-15 ENCOUNTER — Encounter (HOSPITAL_COMMUNITY): Payer: Self-pay

## 2024-09-15 DIAGNOSIS — F1020 Alcohol dependence, uncomplicated: Secondary | ICD-10-CM

## 2024-09-15 NOTE — ED Notes (Signed)
 Pt reports as pleasant and cooperative, denies physical pain and discomforts. Pt affirms to have eaten breakfast. Pt reports readiness for discharge. Pt says he feels 'great'. Pt denies si hi and avh- verbal contract for safety provided. Medications reviewed, questions denied.

## 2024-09-15 NOTE — ED Provider Notes (Signed)
 Pt had a high blood pressure recorded this morning of 150/110. After clonidine it is 132/85. Pt is ready to discharge and states that he is going to go to his sister's ex boyfriend's house. He will still to CD-IOP though he missed the appointment this morning due to his blood pressure. Pt reports that he is not on medication for blood pressure as his blood pressure in the past has been good. He says that he does have a primary care provider and he will se that provider to follow up regarding his blood pressure. He will receive another appointment to start CD-IOP. He states he has high motivation to do this. There is not a history of suicidal ideation or suicide attempts. There is not a family history of suicide.

## 2024-09-15 NOTE — ED Notes (Signed)
 Pt is sleeping at the moment. No acute distress noted. Respirations are even and labored. Q15 safety checks in place.

## 2024-09-15 NOTE — Telephone Encounter (Signed)
 Therapist receives a referral from [place] from [facility] that says this pt was still in the hospital today and could not make his appt this morning.  Timothy Estrada says he is now discharged and gives his phone number that is in Epic. This therapist calls him and reaches voice mail that is not set up. Therapist will send a letter to this pt.  Darice Simpler, MS, LMFT, LCAS

## 2024-09-15 NOTE — ED Notes (Signed)
 Pt was observed in the Dayroom with peers. Denies SI.HI. AVH. Pt is looking forward to his discharge tomorrow. He reports satisfaction from the service he received here.

## 2024-09-15 NOTE — ED Notes (Signed)
 Discharge paperwork discussed with pt, questioned denied. Items returned to pt. Pt escorted off unit by staff.

## 2024-09-15 NOTE — ED Notes (Signed)
 Pt is sleeping at the moment. No acute distress noted. Q15 safety checks in place.

## 2024-09-15 NOTE — ED Notes (Signed)
 Pt administered prn clonidine for elevated BP 150/110 mmhg manual. Provider notified. SW notified about disruption in discharge plan. Pt denies current symptoms of elevated bp.

## 2024-09-16 ENCOUNTER — Telehealth (HOSPITAL_COMMUNITY): Payer: Self-pay

## 2024-09-16 NOTE — Telephone Encounter (Signed)
 Front desk messages this therapist to say that Timothy Estrada left a voice mail saying he would like to get rescheduled for his CCA.  This therapist calls the first number he left [phone] ,) and leaves a HIPAA complaint message requesting a return call.  Darice Simpler, Mdiv, MS, LMFT, LCAS

## 2024-09-16 NOTE — Telephone Encounter (Signed)
 Please advise  Patient is calling in because he is requesting a referral to have a cyst removed. Patient says it is on his chest. He hasn't spoken with the provider about it because he didn't have insurance. Patient has an appointment 10/12/24, but would like the referral put in. Please follow up with patient.

## 2024-09-17 ENCOUNTER — Other Ambulatory Visit: Payer: Self-pay

## 2024-09-17 DIAGNOSIS — L720 Epidermal cyst: Secondary | ICD-10-CM

## 2024-09-17 NOTE — Telephone Encounter (Signed)
 LM for patient to call office back with person answering the phone

## 2024-09-22 ENCOUNTER — Ambulatory Visit: Payer: Self-pay

## 2024-09-22 NOTE — Telephone Encounter (Signed)
 Patient called and was not at home. Patient called to offer earlier appt

## 2024-09-22 NOTE — Telephone Encounter (Signed)
 FYI Only or Action Required?: FYI only for provider.  Patient was last seen in primary care on 07/15/2024 by Leavy Rode, S[place], PA-C.  Called Nurse Triage reporting Hypertension.  Symptoms began a week ago.  Interventions attempted: Nothing.  Symptoms are: unchanged.  Triage Disposition: See Physician Within 24 Hours  Patient/caregiver understands and will follow disposition?: Yes  **Referred to mobile bus/UC, as there are no appts. Available within a reasonable time frame, he agrees to be seen at [facility] today**       Copied from CRM [id number]. Topic: Clinical - Red Word Triage >> Sep 22, 2024  9:43 AM Ashley R wrote: Red Word that prompted transfer to Nurse Triage: BP 135/115 was higher last few days. Cyst on chest. Scheduled for Nov 11 but called to get an earlier appt.    ----------------------------------------------------------------------- From previous Reason for Contact - Cancel/Reschedule: Patient/patient representative is calling to cancel or reschedule an appointment. Refer to attachments for appointment information. Reason for Disposition  Systolic BP >= 180 OR Diastolic >= 110  Answer Assessment - Initial Assessment Questions 1. BLOOD PRESSURE: What is your blood pressure? Did you take at least two measurements 5 minutes apart?      BP 135/115  2. ONSET: When did you take your blood pressure?      20 minutes ago  3. HOW: How did you take your blood pressure? (e.g., automatic home BP monitor, visiting nurse)     automatic home BP monitor  4. HISTORY: Do you have a history of high blood pressure?     No diagnosis of HTN per patient   5. MEDICINES: Are you taking any medicines for blood pressure? Have you missed any doses recently?     No   6. OTHER SYMPTOMS: Do you have any symptoms? (e.g., blurred vision, chest pain, difficulty breathing, headache, weakness)  No acute symptoms currently. Patient reports cyst on upper chest area  that he would like provider to see, the area itches and burns; no current drainage noted, but has drained clear fluid in the past. Referred to mobile bus/UC, as there are no appts. Available within a reasonable time frame. He agrees to be seen at [facility] today  Protocols used: Blood Pressure - High-A-AH

## 2024-10-12 ENCOUNTER — Ambulatory Visit (INDEPENDENT_AMBULATORY_CARE_PROVIDER_SITE_OTHER)

## 2024-10-12 ENCOUNTER — Other Ambulatory Visit: Payer: Self-pay

## 2024-10-12 VITALS — BP 171/120 | HR 63 | Temp 98.7°F | Resp 16 | Ht 69.0 in | Wt 164.2 lb

## 2024-10-12 DIAGNOSIS — I10 Essential (primary) hypertension: Secondary | ICD-10-CM

## 2024-10-12 DIAGNOSIS — N6001 Solitary cyst of right breast: Secondary | ICD-10-CM

## 2024-10-12 MED ORDER — AMLODIPINE BESYLATE 5 MG PO TABS: 5.0000 mg | ORAL_TABLET | Freq: Every day | ORAL | 2 refills | Status: AC

## 2024-10-12 MED ORDER — LISINOPRIL 20 MG PO TABS: 20.0000 mg | ORAL_TABLET | Freq: Every day | ORAL | 2 refills | Status: AC

## 2024-10-12 NOTE — Progress Notes (Signed)
     Patient ID: Timothy Estrada, male    DOB: Jun 26, 1971  MRN: [id number]  CC: Medical Management of Chronic Issues (Patient would like to address the cyst right breast that has gotten bigger over the last years. Patient said the cyst is burning and itching on the inside.  Patient would like to address his HTN)   Subjective: Timothy Estrada is a 53 y.o. male who presents to clinic for evaluation of cyst on right breast. Pt reports cyst has been present and growing for the past 4 years. It is painless, does report itchiness and slight burning sensation. He previously was evaluated for this but could not afford visit for removal. Denies fever, weight loss or limitation in mobility.    No Known Allergies  ROS: Review of Systems Negative except as stated above  PHYSICAL EXAM: BP (!) 171/120   Pulse 63   Temp 98.7 F (37.1 C) (Oral)   Resp 16   Ht 5' 9 (1.753 m)   Wt 164 lb 3.2 oz (74.5 kg)   SpO2 97%   BMI 24.25 kg/m   Physical Exam  General: well-appearing, no acute distress Skin: >10cm soft fluid filled cyst right breast, non-painful to palpation, no crepitus, not warm to touch, no surrounding edema.    Cardiovascular: regular heart rate and rhythm, normal S1/S2, no murmurs, gallops, or rubs, peripheral pulses 2+ bilaterally Chest: no skeletal deformity, lungs clear to auscultation bilaterally, equal breath sounds bilaterally Musculoskeletal: normal gait Extremities: no peripheral edema  ASSESSMENT AND PLAN:  1. Cyst (solitary) of breast, right (Primary) - Ambulatory referral to Dermatology - MM 3D DIAGNOSTIC MAMMOGRAM UNILATERAL RIGHT BREAST; Future - Ambulatory referral to General Surgery  2. Primary hypertension BP: 171/120, Pt Is not currently on any medication.  - lisinopril (ZESTRIL) 20 MG tablet; Take 1 tablet (20 mg total) by mouth daily.  Dispense: 90 tablet; Refill: 2 - amLODipine (NORVASC) 5 MG tablet; Take 1 tablet (5 mg total) by mouth daily.  Dispense: 90  tablet; Refill: 2   Patient was given the opportunity to ask questions.  Patient verbalized understanding of the plan and was able to repeat key elements of the plan.    Orders Placed This Encounter  Procedures   MM 3D DIAGNOSTIC MAMMOGRAM UNILATERAL RIGHT BREAST   Ambulatory referral to Dermatology   Ambulatory referral to General Surgery     Requested Prescriptions   Signed Prescriptions Disp Refills   lisinopril (ZESTRIL) 20 MG tablet 90 tablet 2    Sig: Take 1 tablet (20 mg total) by mouth daily.   amLODipine (NORVASC) 5 MG tablet 90 tablet 2    Sig: Take 1 tablet (5 mg total) by mouth daily.    Return in about 1 month (around 11/11/2024) for physical, follow-up blood pressure.  Sula Leavy Rode, PA-C

## 2024-10-21 ENCOUNTER — Ambulatory Visit
Admission: RE | Admit: 2024-10-21 | Discharge: 2024-10-21 | Disposition: A | Discharge status: Home / Self Care | Source: Ambulatory Visit

## 2024-10-21 ENCOUNTER — Other Ambulatory Visit: Payer: Self-pay

## 2024-10-21 DIAGNOSIS — N6001 Solitary cyst of right breast: Secondary | ICD-10-CM

## 2024-10-21 DIAGNOSIS — N6310 Unspecified lump in the right breast, unspecified quadrant: Secondary | ICD-10-CM

## 2024-10-25 ENCOUNTER — Ambulatory Visit: Payer: Self-pay | Admitting: Surgery

## 2024-10-25 NOTE — Progress Notes (Signed)
 REFERRING PHYSICIAN:  Leavy Lucas Fox PROVIDER:  DEBBY CURTISTINE SHIPPER, MD MRN: [id number] DOB: 1971/02/18 DATE OF ENCOUNTER: 10/25/2024 Subjective    Chief Complaint: New Consultation (cyst on right brst)   History of Present Illness: Timothy Estrada is a 53 y.o. male who is seen today as an office consultation for evaluation of New Consultation (cyst on right brst)   53 year old male who presents for right breast mass.'s been present for multiple years getting larger.  Does cause pain and discomfort.  It started about pea size now is a size of a golf ball.  Ultrasound shows a complex 6.7 cm cyst.  Biopsy is scheduled.  That has not been done yet but we done soon.  No fever or chills or history of trauma.  No family history of breast cancer.    Review of Systems: A complete review of systems was obtained from the patient.  I have reviewed this information and discussed as appropriate with the patient.  See HPI as well for other ROS.     Medical History: History reviewed. No pertinent past medical history.  There is no problem list on file for this patient.   Past Surgical History:  Procedure Laterality Date   Fracture surgery     Orif clavicular fracture (Right)       No Known Allergies  Current Outpatient Medications on File Prior to Visit  Medication Sig Dispense Refill   amLODIPine  (NORVASC ) 5 MG tablet Take 5 mg by mouth once daily     gabapentin  (NEURONTIN ) 100 MG capsule Take 100 mg by mouth 3 (three) times daily     lisinopriL  (ZESTRIL ) 20 MG tablet Take 20 mg by mouth once daily     No current facility-administered medications on file prior to visit.    Family History  Family history unknown: Yes     Social History   Tobacco Use  Smoking Status Every Day   Types: Cigarettes  Smokeless Tobacco Never     Social History   Socioeconomic History   Marital status: Single  Tobacco Use   Smoking status: Every Day    Types: Cigarettes    Smokeless tobacco: Never  Substance and Sexual Activity   Alcohol  use: Never   Drug use: Never   Sexual activity: Defer   Social Drivers of Health   Food Insecurity: No Food Insecurity (09/10/2024)   Received from [facility]   Hunger Vital Sign    Within the past 12 months, you worried that your food would run out before you got the money to buy more.: Never true    Within the past 12 months, the food you bought just didn't last and you didn't have money to get more.: Never true  Transportation Needs: No Transportation Needs (09/10/2024)   Received from [facility] - Transportation    In the past 12 months, has lack of transportation kept you from medical appointments or from getting medications?: No    In the past 12 months, has lack of transportation kept you from meetings, work, or from getting things needed for daily living?: No    Objective:   Vitals:   10/25/24 1404 10/25/24 1407  BP: (!) 153/105   Pulse: 75   Temp: 36.8 C (98.2 F)   TempSrc: Temporal   SpO2: 96%   Weight: 75.8 kg (167 lb 3.2 oz)   Height: 175.3 cm (5' 9)   PainSc:  0-No pain  PainLoc:  Breast  Body mass index is 24.69 kg/m.  Physical Exam Exam conducted with a chaperone present.  HENT:     Head: Normocephalic.  Cardiovascular:     Rate and Rhythm: Normal rate.  Pulmonary:     Effort: Pulmonary effort is normal.  Chest:  Breasts:    Left: Normal.       Comments: 5 to 6 cm cystic mass mobile.  Mild skin changes noted. Musculoskeletal:        General: Normal range of motion.     Cervical back: Normal range of motion.  Lymphadenopathy:     Upper Body:     Right upper body: No supraclavicular or axillary adenopathy.     Left upper body: No supraclavicular or axillary adenopathy.  Skin:    General: Skin is warm.        Labs, Imaging and Diagnostic Testing:  CLINICAL DATA:  53 year old gentleman with palpable lump of the RIGHT breast for 4 years presents for  BILATERAL diagnostic mammogram. His family history is positive for breast cancer in his mother.   EXAM: DIGITAL DIAGNOSTIC BILATERAL MAMMOGRAM WITH TOMOSYNTHESIS AND CAD; ULTRASOUND RIGHT BREAST LIMITED   TECHNIQUE: Bilateral digital diagnostic mammography and breast tomosynthesis was performed. The images were evaluated with computer-aided detection. ; Targeted ultrasound examination of the right breast was performed   COMPARISON:  Previous exam(s).   ACR Breast Density Category b: There are scattered areas of fibroglandular density.   FINDINGS: RIGHT:   Mammogram: Oval circumscribed mass seen in the outer RIGHT breast. No additional suspicious mass, distortion, or microcalcifications are identified to suggest presence of malignancy.   Physical examination: Focused examination of the outer RIGHT breast demonstrates mild diffuse red discoloration of the skin with an underlying soft flocculent mass.   Ultrasound: Targeted sonographic evaluation of the palpable region in the RIGHT breast demonstrates an oval circumscribed hypoechoic mass measuring 6.7 x 3.0 x 5.7 cm. Nonenlarged lymph nodes seen in the RIGHT axilla.   LEFT:   Mammogram: Minimal retroareolar gynecomastia. No suspicious mass, distortion, or microcalcifications are identified to suggest presence of malignancy.   IMPRESSION: Indeterminate 6.7 cm RIGHT breast mass, most likely a complicated cyst.   RECOMMENDATION: Ultrasound-guided aspiration and possible biopsy of 6.7 cm RIGHT breast mass.   I have discussed the findings and recommendations with the patient. The aspiration and biopsy procedures were explained to the patient and questions were answered. Patient expressed their understanding of the biopsy recommendation.   Patient will be scheduled for aspiration and possible biopsy at his earliest convenience by the schedulers.   Ordering provider will be notified. If applicable, a reminder  letter will be sent to the patient regarding the next appointment.   BI-RADS CATEGORY  4: Suspicious.     Electronically Signed   By: Aliene Lloyd M.D.   On: 10/21/2024 13:4 Assessment and Plan:     Diagnoses and all orders for this visit:  Mass of upper outer quadrant of right breast    He is scheduled for biopsy but given increasing size and symptoms, recommend excision.  Discussed possibilities and the need for additional surgery but go ahead and remove this because it is causing symptoms been present for multiple years and getting larger.  His primary cystic but not a sebaceous cyst.  This appears to be within the breast parenchyma itself.  No history of trauma.The procedure has been discussed with the patient. Alternatives to surgery have been discussed with the patient.  Risks of surgery include bleeding,  Infection,  Seroma formation, death,  and the need for further surgery.   The patient understands and wishes to proceed.     DEBBY CURTISTINE SHIPPER, MD    I spent a total of 45 minutes in both face-to-face and non-face-to-face activities, excluding procedures performed, for this visit on the date of this encounter.

## 2024-11-15 ENCOUNTER — Ambulatory Visit

## 2024-11-29 ENCOUNTER — Encounter (HOSPITAL_BASED_OUTPATIENT_CLINIC_OR_DEPARTMENT_OTHER): Payer: Self-pay | Admitting: Surgery

## 2024-12-01 ENCOUNTER — Other Ambulatory Visit: Payer: Self-pay

## 2024-12-01 ENCOUNTER — Encounter (HOSPITAL_BASED_OUTPATIENT_CLINIC_OR_DEPARTMENT_OTHER): Payer: Self-pay | Admitting: Surgery

## 2024-12-02 NOTE — Anesthesia Preprocedure Evaluation (Addendum)
"                                    Anesthesia Evaluation  Patient identified by MRN, date of birth, ID band Patient awake    Reviewed: Allergy & Precautions, NPO status , Patient's Chart, lab work & pertinent test results  History of Anesthesia Complications Negative for: history of anesthetic complications  Airway Mallampati: II  TM Distance: >3 FB Neck ROM: Full    Dental  (+) Missing, Loose,    Pulmonary Current Smoker and Patient abstained from smoking.   Pulmonary exam normal        Cardiovascular hypertension, Pt. on medications Normal cardiovascular exam     Neuro/Psych negative neurological ROS     GI/Hepatic negative GI ROS,,,(+)     substance abuse  alcohol  use and marijuana use  Endo/Other  negative endocrine ROS    Renal/GU negative Renal ROS     Musculoskeletal negative musculoskeletal ROS (+)    Abdominal   Peds  Hematology negative hematology ROS (+)   Anesthesia Other Findings RIGHT BREAST MASS  Reproductive/Obstetrics                              Anesthesia Physical Anesthesia Plan  ASA: 3  Anesthesia Plan: General   Post-op Pain Management: Tylenol  PO (pre-op)* and Celebrex  PO (pre-op)*   Induction: Intravenous  PONV Risk Score and Plan: 2 and Treatment may vary due to age or medical condition, Ondansetron , Dexamethasone  and Midazolam   Airway Management Planned: LMA  Additional Equipment: None  Intra-op Plan:   Post-operative Plan: Extubation in OR  Informed Consent: I have reviewed the patients History and Physical, chart, labs and discussed the procedure including the risks, benefits and alternatives for the proposed anesthesia with the patient or authorized representative who has indicated his/her understanding and acceptance.     Dental advisory given  Plan Discussed with: CRNA  Anesthesia Plan Comments:          Anesthesia Quick Evaluation  "

## 2024-12-07 ENCOUNTER — Ambulatory Visit (HOSPITAL_BASED_OUTPATIENT_CLINIC_OR_DEPARTMENT_OTHER)
Admission: RE | Admit: 2024-12-07 | Discharge: 2024-12-07 | Disposition: A | Discharge status: Home / Self Care | Attending: Surgery | Admitting: Surgery

## 2024-12-07 ENCOUNTER — Encounter (HOSPITAL_BASED_OUTPATIENT_CLINIC_OR_DEPARTMENT_OTHER): Payer: Self-pay | Admitting: Anesthesiology

## 2024-12-07 ENCOUNTER — Other Ambulatory Visit: Payer: Self-pay

## 2024-12-07 ENCOUNTER — Encounter (HOSPITAL_BASED_OUTPATIENT_CLINIC_OR_DEPARTMENT_OTHER)
Admission: RE | Disposition: A | Discharge status: Home / Self Care | Payer: Self-pay | Source: Home / Self Care | Attending: Surgery

## 2024-12-07 ENCOUNTER — Ambulatory Visit (HOSPITAL_BASED_OUTPATIENT_CLINIC_OR_DEPARTMENT_OTHER): Payer: Self-pay | Admitting: Anesthesiology

## 2024-12-07 ENCOUNTER — Encounter (HOSPITAL_BASED_OUTPATIENT_CLINIC_OR_DEPARTMENT_OTHER): Payer: Self-pay | Admitting: Surgery

## 2024-12-07 DIAGNOSIS — Z79899 Other long term (current) drug therapy: Secondary | ICD-10-CM | POA: Insufficient documentation

## 2024-12-07 DIAGNOSIS — F17210 Nicotine dependence, cigarettes, uncomplicated: Secondary | ICD-10-CM

## 2024-12-07 DIAGNOSIS — R222 Localized swelling, mass and lump, trunk: Secondary | ICD-10-CM

## 2024-12-07 DIAGNOSIS — N6081 Other benign mammary dysplasias of right breast: Secondary | ICD-10-CM | POA: Insufficient documentation

## 2024-12-07 DIAGNOSIS — I10 Essential (primary) hypertension: Secondary | ICD-10-CM | POA: Insufficient documentation

## 2024-12-07 DIAGNOSIS — N6001 Solitary cyst of right breast: Secondary | ICD-10-CM | POA: Insufficient documentation

## 2024-12-07 DIAGNOSIS — Z01818 Encounter for other preprocedural examination: Secondary | ICD-10-CM

## 2024-12-07 DIAGNOSIS — L720 Epidermal cyst: Secondary | ICD-10-CM | POA: Diagnosis not present

## 2024-12-07 DIAGNOSIS — Z803 Family history of malignant neoplasm of breast: Secondary | ICD-10-CM | POA: Insufficient documentation

## 2024-12-07 HISTORY — DX: Nicotine dependence, unspecified, uncomplicated: F17.200

## 2024-12-07 HISTORY — DX: Essential (primary) hypertension: I10

## 2024-12-07 HISTORY — DX: Alcohol abuse, uncomplicated: F10.10

## 2024-12-07 SURGERY — EXCISION, MASS, BREAST
Anesthesia: General | Site: Breast | Laterality: Right

## 2024-12-07 MED ORDER — DEXMEDETOMIDINE HCL IN NACL 80 MCG/20ML IV SOLN
INTRAVENOUS | Status: DC | PRN
  Administered 2024-12-07 (×2): 4 ug via INTRAVENOUS
  Administered 2024-12-07: 8 ug via INTRAVENOUS

## 2024-12-07 MED ORDER — DEXAMETHASONE SOD PHOSPHATE PF 10 MG/ML IJ SOLN
INTRAMUSCULAR | Status: DC | PRN
  Administered 2024-12-07: 4 mg via INTRAVENOUS

## 2024-12-07 MED ORDER — OXYCODONE HCL 5 MG PO TABS
5.0000 mg | ORAL_TABLET | Freq: Once | ORAL | Status: AC | PRN
  Administered 2024-12-07: 5 mg via ORAL

## 2024-12-07 MED ORDER — OXYCODONE HCL 5 MG/5ML PO SOLN: 5.0000 mg | Freq: Once | ORAL | Status: AC | PRN

## 2024-12-07 MED ORDER — ACETAMINOPHEN 500 MG PO TABS
ORAL_TABLET | ORAL | Status: AC
  Filled 2024-12-07: qty 2

## 2024-12-07 MED ORDER — CELECOXIB 200 MG PO CAPS
200.0000 mg | ORAL_CAPSULE | ORAL | Status: AC
  Administered 2024-12-07: 200 mg via ORAL

## 2024-12-07 MED ORDER — 0.9 % SODIUM CHLORIDE (POUR BTL) OPTIME
TOPICAL | Status: DC | PRN
  Administered 2024-12-07: 1000 mL

## 2024-12-07 MED ORDER — CEFAZOLIN SODIUM-DEXTROSE 2-4 GM/100ML-% IV SOLN
INTRAVENOUS | Status: AC
  Filled 2024-12-07: qty 100

## 2024-12-07 MED ORDER — MIDAZOLAM HCL 2 MG/2ML IJ SOLN
INTRAMUSCULAR | Status: AC
  Filled 2024-12-07: qty 2

## 2024-12-07 MED ORDER — DOXYCYCLINE HYCLATE 100 MG PO TABS: 100.0000 mg | ORAL_TABLET | Freq: Two times a day (BID) | ORAL | 0 refills | Status: AC

## 2024-12-07 MED ORDER — ONDANSETRON HCL 4 MG/2ML IJ SOLN
INTRAMUSCULAR | Status: DC | PRN
  Administered 2024-12-07: 4 mg via INTRAVENOUS

## 2024-12-07 MED ORDER — FENTANYL CITRATE (PF) 100 MCG/2ML IJ SOLN
INTRAMUSCULAR | Status: AC
  Filled 2024-12-07: qty 2

## 2024-12-07 MED ORDER — ACETAMINOPHEN 500 MG PO TABS
1000.0000 mg | ORAL_TABLET | Freq: Once | ORAL | Status: AC
  Administered 2024-12-07: 1000 mg via ORAL

## 2024-12-07 MED ORDER — EPHEDRINE SULFATE-NACL 50-0.9 MG/10ML-% IV SOSY
PREFILLED_SYRINGE | INTRAVENOUS | Status: DC | PRN
  Administered 2024-12-07: 10 mg via INTRAVENOUS
  Administered 2024-12-07 (×2): 5 mg via INTRAVENOUS

## 2024-12-07 MED ORDER — LIDOCAINE 2% (20 MG/ML) 5 ML SYRINGE
INTRAMUSCULAR | Status: DC | PRN
  Administered 2024-12-07: 100 mg via INTRAVENOUS

## 2024-12-07 MED ORDER — OXYCODONE HCL 5 MG PO TABS
ORAL_TABLET | ORAL | Status: AC
  Filled 2024-12-07: qty 1

## 2024-12-07 MED ORDER — OXYCODONE HCL 5 MG PO TABS: 5.0000 mg | ORAL_TABLET | Freq: Four times a day (QID) | ORAL | 0 refills | Status: AC | PRN

## 2024-12-07 MED ORDER — FENTANYL CITRATE (PF) 100 MCG/2ML IJ SOLN
25.0000 ug | INTRAMUSCULAR | Status: DC | PRN
  Administered 2024-12-07 (×3): 50 ug via INTRAVENOUS

## 2024-12-07 MED ORDER — MIDAZOLAM HCL (PF) 2 MG/2ML IJ SOLN
INTRAMUSCULAR | Status: DC | PRN
  Administered 2024-12-07: 2 mg via INTRAVENOUS

## 2024-12-07 MED ORDER — CELECOXIB 200 MG PO CAPS
ORAL_CAPSULE | ORAL | Status: AC
  Filled 2024-12-07: qty 1

## 2024-12-07 MED ORDER — CHLORHEXIDINE GLUCONATE CLOTH 2 % EX PADS: 6.0000 | MEDICATED_PAD | Freq: Once | CUTANEOUS | Status: DC

## 2024-12-07 MED ORDER — BUPIVACAINE-EPINEPHRINE 0.25% -1:200000 IJ SOLN
INTRAMUSCULAR | Status: DC | PRN
  Administered 2024-12-07: 30 mL

## 2024-12-07 MED ORDER — FENTANYL CITRATE (PF) 100 MCG/2ML IJ SOLN
INTRAMUSCULAR | Status: DC | PRN
  Administered 2024-12-07 (×2): 50 ug via INTRAVENOUS

## 2024-12-07 MED ORDER — DROPERIDOL 2.5 MG/ML IJ SOLN: 0.6250 mg | Freq: Once | INTRAMUSCULAR | Status: DC | PRN

## 2024-12-07 MED ORDER — LACTATED RINGERS IV SOLN: INTRAVENOUS | Status: DC

## 2024-12-07 MED ORDER — CEFAZOLIN SODIUM-DEXTROSE 2-4 GM/100ML-% IV SOLN: 2.0000 g | INTRAVENOUS | Status: DC

## 2024-12-07 MED ORDER — PROPOFOL 10 MG/ML IV BOLUS
INTRAVENOUS | Status: DC | PRN
  Administered 2024-12-07: 100 ug via INTRAVENOUS
  Administered 2024-12-07: 200 ug via INTRAVENOUS

## 2024-12-07 MED ORDER — CEFAZOLIN SODIUM-DEXTROSE 2-3 GM-%(50ML) IV SOLR
INTRAVENOUS | Status: DC | PRN
  Administered 2024-12-07: 2 g via INTRAVENOUS

## 2024-12-07 SURGICAL SUPPLY — 38 items
BINDER BREAST LRG (GAUZE/BANDAGES/DRESSINGS) IMPLANT
BINDER BREAST MEDIUM (GAUZE/BANDAGES/DRESSINGS) IMPLANT
BINDER BREAST XLRG (GAUZE/BANDAGES/DRESSINGS) IMPLANT
BINDER BREAST XXLRG (GAUZE/BANDAGES/DRESSINGS) IMPLANT
BLADE SURG 15 STRL LF DISP TIS (BLADE) ×1 IMPLANT
CANISTER SUCT 1200ML W/VALVE (MISCELLANEOUS) ×1 IMPLANT
CHLORAPREP W/TINT 26 (MISCELLANEOUS) ×1 IMPLANT
CLIP APPLIE 9.375 MED OPEN (MISCELLANEOUS) IMPLANT
COVER BACK TABLE 60X90IN (DRAPES) ×1 IMPLANT
COVER MAYO STAND STRL (DRAPES) ×1 IMPLANT
DERMABOND ADVANCED .7 DNX12 (GAUZE/BANDAGES/DRESSINGS) ×1 IMPLANT
DRAPE LAPAROSCOPIC ABDOMINAL (DRAPES) IMPLANT
DRAPE LAPAROTOMY 100X72 PEDS (DRAPES) ×1 IMPLANT
DRAPE UTILITY XL STRL (DRAPES) ×1 IMPLANT
ELECT COATED BLADE 2.86 ST (ELECTRODE) ×1 IMPLANT
ELECTRODE REM PT RTRN 9FT ADLT (ELECTROSURGICAL) ×1 IMPLANT
GLOVE BIOGEL PI IND STRL 8 (GLOVE) ×1 IMPLANT
GLOVE ECLIPSE 8.0 STRL XLNG CF (GLOVE) ×1 IMPLANT
GOWN STRL REUS W/ TWL LRG LVL3 (GOWN DISPOSABLE) ×2 IMPLANT
GOWN STRL REUS W/ TWL XL LVL3 (GOWN DISPOSABLE) ×1 IMPLANT
HEMOSTAT ARISTA ABSORB 3G PWDR (HEMOSTASIS) IMPLANT
KIT MARKER MARGIN INK (KITS) IMPLANT
NEEDLE HYPO 25X1 1.5 SAFETY (NEEDLE) ×1 IMPLANT
PACK BASIN DAY SURGERY FS (CUSTOM PROCEDURE TRAY) ×1 IMPLANT
PENCIL SMOKE EVACUATOR (MISCELLANEOUS) ×1 IMPLANT
SLEEVE SCD COMPRESS KNEE MED (STOCKING) ×1 IMPLANT
SOLN 0.9% NACL POUR BTL 1000ML (IV SOLUTION) ×1 IMPLANT
SPIKE FLUID TRANSFER (MISCELLANEOUS) ×1 IMPLANT
SPONGE T-LAP 4X18 ~~LOC~~+RFID (SPONGE) ×1 IMPLANT
STAPLER SKIN PROX WIDE 3.9 (STAPLE) IMPLANT
SUT MON AB 4-0 PC3 18 (SUTURE) ×1 IMPLANT
SUT SILK 2 0 SH (SUTURE) IMPLANT
SUT VICRYL 3-0 CR8 SH (SUTURE) ×1 IMPLANT
SYR CONTROL 10ML LL (SYRINGE) ×1 IMPLANT
TOWEL GREEN STERILE FF (TOWEL DISPOSABLE) ×2 IMPLANT
TRAY FAXITRON CT DISP (TRAY / TRAY PROCEDURE) IMPLANT
TUBE CONNECTING 20X1/4 (TUBING) ×1 IMPLANT
YANKAUER SUCT BULB TIP NO VENT (SUCTIONS) ×1 IMPLANT

## 2024-12-07 NOTE — Transfer of Care (Signed)
 Immediate Anesthesia Transfer of Care Note  Patient: Timothy Estrada  Procedure(s) Performed: EXCISION, MASS, BREAST (Right: Breast)  Patient Location: PACU  Anesthesia Type:General  Level of Consciousness: awake, alert , and oriented  Airway & Oxygen Therapy: Patient Spontanous Breathing  Post-op Assessment: Report given to RN and Post -op Vital signs reviewed and stable  Post vital signs: Reviewed and stable  Last Vitals:  Vitals Value Taken Time  BP 117/81 12/07/24 10:00  Temp 36.3 C 12/07/24 09:59  Pulse 80 12/07/24 10:03  Resp 11 12/07/24 10:03  SpO2 95 % 12/07/24 10:03  Vitals shown include unfiled device data.  Last Pain:  Vitals:   12/07/24 0835  TempSrc: Temporal  PainSc: 0-No pain      Patients Stated Pain Goal: 4 (12/07/24 0835)  Complications: No notable events documented.

## 2024-12-07 NOTE — Interval H&P Note (Signed)
 History and Physical Interval Note:  12/07/2024 8:39 AM  Jerel PARAS Rodd  has presented today for surgery, with the diagnosis of RIGHT BREAST MASS.  The various methods of treatment have been discussed with the patient and family. After consideration of risks, benefits and other options for treatment, the patient has consented to  Procedures: EXCISION, MASS, BREAST (Right) as a surgical intervention.  The patient's history has been reviewed, patient examined, no change in status, stable for surgery.  I have reviewed the patient's chart and labs.  Questions were answered to the patient's satisfaction.   The procedure has been discussed with the patient.  Alternative therapies have been discussed with the patient.  Operative risks include bleeding,  Infection,  Organ injury,  Nerve injury,  Blood vessel injury,  DVT,  Pulmonary embolism,  Death,  And possible reoperation.  Medical management risks include worsening of present situation.  The success of the procedure is 50 -90 % at treating patients symptoms.  The patient understands and agrees to proceed.   Millenia Waldvogel A Francisco Eyerly

## 2024-12-07 NOTE — Discharge Instructions (Addendum)
 No Tylenol  or NSAIDs (Ibuprofen , Aleve, etc) before 2:40pm today.  #######################################################  GENERAL SURGERY: POST OP INSTRUCTIONS  ######################################################################  EAT Gradually transition to a high fiber diet with a fiber supplement over the next few weeks after discharge.  Start with a pureed / full liquid diet (see below)  WALK Walk an hour a day.  Control your pain to do that.    CONTROL PAIN Control pain so that you can walk, sleep, tolerate sneezing/coughing, go up/down stairs.  HAVE A BOWEL MOVEMENT DAILY Keep your bowels regular to avoid problems.  OK to try a laxative to override constipation.  OK to use an antidairrheal to slow down diarrhea.  Call if not better after 2 tries  CALL IF YOU HAVE PROBLEMS/CONCERNS Call if you are still struggling despite following these instructions. Call if you have concerns not answered by these instructions  ######################################################################    DIET: Follow a light bland diet & liquids the first 24 hours after arrival home, such as soup, liquids, starches, etc.  Be sure to drink plenty of fluids.  Quickly advance to a usual solid diet within a few days.  Avoid fast food or heavy meals as your are more likely to get nauseated or have irregular bowels.  A low-fat, high-fiber diet for the rest of your life is ideal.    Take your usually prescribed home medications unless otherwise directed. Blood thinners:  You can restart any strong blood thinners after the second postoperative day  for example: COUMADIN (warfarin), XERELTO (rivaroxaban), ELIQUIS (apixaban), PLAVIX (clopidigrel), BRILINTA (ticagrelor), EFFIENT (prasugrel), PRADAXA (dabigatran), etc  Continue aspirin before & after surgery..     Some oozing/bleeding the first 1-2 weeks is common but should taper down & be small volume.    If you are passing many large clots or  having uncontrolling bleeding, call your surgeon  PAIN CONTROL: Pain is best controlled by a usual combination of three different methods TOGETHER: Ice/Heat Over the counter pain medication Prescription pain medication Most patients will experience some swelling and bruising around the incisions.  Ice packs or heating pads (30-60 minutes up to 6 times a day) will help. Use ice for the first few days to help decrease swelling and bruising, then switch to heat to help relax tight/sore spots and speed recovery.  Some people prefer to use ice alone, heat alone, alternating between ice & heat.  Experiment to what works for you.  Swelling and bruising can take several weeks to resolve.   It is helpful to take an over-the-counter pain medication regularly for the first few weeks.  Choose one of the following that works best for you: Naproxen (Aleve, etc)  Two 220mg  tabs twice a day Ibuprofen  (Advil , etc) Three 200mg  tabs four times a day (every meal & bedtime) Acetaminophen  (Tylenol , etc) 500-650mg  four times a day (every meal & bedtime) A  prescription for pain medication (such as oxycodone , hydrocodone , etc) should be given to you upon discharge.  Take your pain medication as prescribed.  If you are having problems/concerns with the prescription medicine (does not control pain, nausea, vomiting, rash, itching, etc), please call us  [phone] to see if we need to switch you to a different pain medicine that will work better for you and/or control your side effect better. If you need a refill on your pain medication, please contact your pharmacy.  They will contact our office to request authorization. Prescriptions will not be filled after 5 pm or on week-ends.  Avoid getting  constipated.  Between the surgery and the pain medications, it is common to experience some constipation.  Increasing fluid intake and taking a fiber supplement (such as Metamucil, Citrucel, FiberCon, MiraLax , etc) 1-2 times a day  regularly will usually help prevent this problem from occurring.  A mild laxative (prune juice, Milk of Magnesia, MiraLax , etc) should be taken according to package directions if there are no bowel movements after 48 hours.   Watch out for diarrhea.  If you have many loose bowel movements, simplify your diet to bland foods & liquids for a few days.  Stop any stool softeners and decrease your fiber supplement.  Switching to mild anti-diarrheal medications (Loperamide /Imodium , Kayopectate, Pepto Bismol) can help.  If this worsens or does not improve, please call us .  Wash / shower every day.  You may shower over the dressings as they are waterproof.  Continue to shower over incision(s) after the dressing is off. Remove your waterproof bandages 5 days after surgery.  You may leave the incision open to air.  You may have skin tapes (Steri Strips) covering the incision(s).  Leave them on until one week, then remove.  You may replace a dressing/Band-Aid to cover the incision for comfort if you wish.   ACTIVITIES as tolerated:   You may resume regular (light) daily activities beginning the next day--such as daily self-care, walking, climbing stairs--gradually increasing activities as tolerated.  If you can walk 30 minutes without difficulty, it is safe to try more intense activity such as jogging, treadmill, bicycling, low-impact aerobics, swimming, etc. Save the most intensive and strenuous activity for last such as sit-ups, heavy lifting, contact sports, etc  Refrain from any heavy lifting or straining until you are off narcotics for pain control.   DO NOT PUSH THROUGH PAIN.  Let pain be your guide: If it hurts to do something, don't do it.  Pain is your body warning you to avoid that activity for another week until the pain goes down. You may drive when you are no longer taking prescription pain medication, you can comfortably wear a seatbelt, and you can safely maneuver your car and apply brakes. You may  have sexual intercourse when it is comfortable.   FOLLOW UP in our office Please call [facility] at [phone] to set up an appointment to see your surgeon in the office for a follow-up appointment approximately 2-3 weeks after your surgery. Make sure that you call for this appointment the day you arrive home to insure a convenient appointment time.  9. IF YOU HAVE DISABILITY OR FAMILY LEAVE FORMS, BRING THEM TO THE OFFICE FOR PROCESSING.  DO NOT GIVE THEM TO YOUR DOCTOR.   WHEN TO CALL US  [phone]: Poor pain control Reactions / problems with new medications (rash/itching, nausea, etc)  Fever over 101.5 F (38.5 C) Worsening swelling or bruising Continued bleeding from incision. Increased pain, redness, or drainage from the incision Difficulty breathing / swallowing   The clinic staff is available to answer your questions during regular business hours (8:30am-5pm).  Please dont hesitate to call and ask to speak to one of our nurses for clinical concerns.   If you have a medical emergency, go to the nearest emergency room or call 911.  A surgeon from [facility] is always on call at the [facility], [place] [address], [address], [place], [place]  [address] ? MAIN: [phone] ? TOLL FREE: [phone] ?  FAX [phone] [email or web address]  #######################################################  Post Anesthesia Home Care Instructions  Activity: Get plenty of rest for the remainder of the day. A responsible individual must stay with you for 24 hours following the procedure.  For the next 24 hours, DO NOT: -Drive a car -Advertising copywriter -Drink alcoholic beverages -Take any medication unless instructed by your physician -Make any legal decisions or sign important papers.  Meals: Start with liquid foods such as gelatin or soup. Progress to regular foods as tolerated. Avoid greasy, spicy, heavy foods. If nausea  and/or vomiting occur, drink only clear liquids until the nausea and/or vomiting subsides. Call your physician if vomiting continues.  Special Instructions/Symptoms: Your throat may feel dry or sore from the anesthesia or the breathing tube placed in your throat during surgery. If this causes discomfort, gargle with warm salt water. The discomfort should disappear within 24 hours.  If you had a scopolamine patch placed behind your ear for the management of post- operative nausea and/or vomiting:  1. The medication in the patch is effective for 72 hours, after which it should be removed.  Wrap patch in a tissue and discard in the trash. Wash hands thoroughly with soap and water. 2. You may remove the patch earlier than 72 hours if you experience unpleasant side effects which may include dry mouth, dizziness or visual disturbances. 3. Avoid touching the patch. Wash your hands with soap and water after contact with the patch.

## 2024-12-07 NOTE — Anesthesia Postprocedure Evaluation (Signed)
"   Anesthesia Post Note  Patient: Timothy Estrada  Procedure(s) Performed: EXCISION, MASS, BREAST (Right: Breast)     Patient location during evaluation: PACU Anesthesia Type: General Level of consciousness: awake and alert Pain management: pain level controlled Vital Signs Assessment: post-procedure vital signs reviewed and stable Respiratory status: spontaneous breathing, nonlabored ventilation and respiratory function stable Cardiovascular status: blood pressure returned to baseline Postop Assessment: no apparent nausea or vomiting Anesthetic complications: no   No notable events documented.  Last Vitals:  Vitals:   12/07/24 1015 12/07/24 1030  BP: 124/77 121/77  Pulse: 71 70  Resp: 13 11  Temp:    SpO2: 98% 98%                Vertell Row      "

## 2024-12-07 NOTE — H&P (Signed)
 Chief Complaint: New Consultation (cyst on right brst)  History of Present Illness: ANTHEM FRAZER is a 54 y.o. male who is seen today as an office consultation for evaluation of New Consultation (cyst on right brst)  54 year old male who presents for right breast mass.'s been present for multiple years getting larger. Does cause pain and discomfort. It started about pea size now is a size of a golf ball. Ultrasound shows a complex 6.7 cm cyst. Biopsy is scheduled. That has not been done yet but we done soon. No fever or chills or history of trauma. No family history of breast cancer.  Review of Systems: A complete review of systems was obtained from the patient. I have reviewed this information and discussed as appropriate with the patient. See HPI as well for other ROS.    Medical History: History reviewed. No pertinent past medical history.  There is no problem list on file for this patient.  Past Surgical History:  Procedure Laterality Date  Fracture surgery  Orif clavicular fracture (Right)    No Known Allergies  Current Outpatient Medications on File Prior to Visit  Medication Sig Dispense Refill  amLODIPine  (NORVASC ) 5 MG tablet Take 5 mg by mouth once daily  gabapentin  (NEURONTIN ) 100 MG capsule Take 100 mg by mouth 3 (three) times daily  lisinopriL  (ZESTRIL ) 20 MG tablet Take 20 mg by mouth once daily   No current facility-administered medications on file prior to visit.   Family History  Family history unknown: Yes    Social History   Tobacco Use  Smoking Status Every Day  Types: Cigarettes  Smokeless Tobacco Never    Social History   Socioeconomic History  Marital status: Single  Tobacco Use  Smoking status: Every Day  Types: Cigarettes  Smokeless tobacco: Never  Substance and Sexual Activity  Alcohol  use: Never  Drug use: Never  Sexual activity: Defer   Social Drivers of Health   Food Insecurity: No Food Insecurity (09/10/2024)  Received from  [facility]  Hunger Vital Sign  Within the past 12 months, you worried that your food would run out before you got the money to buy more.: Never true  Within the past 12 months, the food you bought just didn't last and you didn't have money to get more.: Never true  Transportation Needs: No Transportation Needs (09/10/2024)  Received from [facility] - Transportation  In the past 12 months, has lack of transportation kept you from medical appointments or from getting medications?: No  In the past 12 months, has lack of transportation kept you from meetings, work, or from getting things needed for daily living?: No   Objective:   Vitals:  10/25/24 1404 10/25/24 1407  BP: (!) 153/105  Pulse: 75  Temp: 36.8 C (98.2 F)  TempSrc: Temporal  SpO2: 96%  Weight: 75.8 kg (167 lb 3.2 oz)  Height: 175.3 cm (5' 9)  PainSc: 0-No pain  PainLoc: Breast   Body mass index is 24.69 kg/m.  Physical Exam Exam conducted with a chaperone present.  HENT:  Head: Normocephalic.  Cardiovascular:  Rate and Rhythm: Normal rate.  Pulmonary:  Effort: Pulmonary effort is normal.  Chest:  Breasts: Left: Normal.   Comments: 5 to 6 cm cystic mass mobile. Mild skin changes noted. Musculoskeletal:  General: Normal range of motion.  Cervical back: Normal range of motion.  Lymphadenopathy:  Upper Body:  Right upper body: No supraclavicular or axillary adenopathy.  Left upper body: No supraclavicular or  axillary adenopathy.  Skin: General: Skin is warm.     Labs, Imaging and Diagnostic Testing:  CLINICAL DATA: 54 year old gentleman with palpable lump of the RIGHT breast for 4 years presents for BILATERAL diagnostic mammogram. His family history is positive for breast cancer in his mother.  EXAM: DIGITAL DIAGNOSTIC BILATERAL MAMMOGRAM WITH TOMOSYNTHESIS AND CAD; ULTRASOUND RIGHT BREAST LIMITED  TECHNIQUE: Bilateral digital diagnostic mammography and breast tomosynthesis was  performed. The images were evaluated with computer-aided detection. ; Targeted ultrasound examination of the right breast was performed  COMPARISON: Previous exam(s).  ACR Breast Density Category b: There are scattered areas of fibroglandular density.  FINDINGS: RIGHT:  Mammogram: Oval circumscribed mass seen in the outer RIGHT breast. No additional suspicious mass, distortion, or microcalcifications are identified to suggest presence of malignancy.  Physical examination: Focused examination of the outer RIGHT breast demonstrates mild diffuse red discoloration of the skin with an underlying soft flocculent mass.  Ultrasound: Targeted sonographic evaluation of the palpable region in the RIGHT breast demonstrates an oval circumscribed hypoechoic mass measuring 6.7 x 3.0 x 5.7 cm. Nonenlarged lymph nodes seen in the RIGHT axilla.  LEFT:  Mammogram: Minimal retroareolar gynecomastia. No suspicious mass, distortion, or microcalcifications are identified to suggest presence of malignancy.  IMPRESSION: Indeterminate 6.7 cm RIGHT breast mass, most likely a complicated cyst.  RECOMMENDATION: Ultrasound-guided aspiration and possible biopsy of 6.7 cm RIGHT breast mass.  I have discussed the findings and recommendations with the patient. The aspiration and biopsy procedures were explained to the patient and questions were answered. Patient expressed their understanding of the biopsy recommendation.  Patient will be scheduled for aspiration and possible biopsy at his earliest convenience by the schedulers.  Ordering provider will be notified. If applicable, a reminder letter will be sent to the patient regarding the next appointment.  BI-RADS CATEGORY 4: Suspicious.   Electronically Signed By: Aliene Lloyd M.D. On: 10/21/2024 13:4 Assessment and Plan:   Diagnoses and all orders for this visit:  Mass of upper outer quadrant of right breast   He is scheduled for biopsy  but given increasing size and symptoms, recommend excision. Discussed possibilities and the need for additional surgery but go ahead and remove this because it is causing symptoms been present for multiple years and getting larger. His primary cystic but not a sebaceous cyst. This appears to be within the breast parenchyma itself. No history of trauma.The procedure has been discussed with the patient. Alternatives to surgery have been discussed with the patient. Risks of surgery include bleeding, Infection, Seroma formation, death, and the need for further surgery. The patient understands and wishes to proceed.    DEBBY CURTISTINE SHIPPER, MD

## 2024-12-07 NOTE — Anesthesia Procedure Notes (Addendum)
 Procedure Name: LMA Insertion Date/Time: 12/07/2024 9:07 AM  Performed by: Leotha Andrez DEL, CRNAPre-anesthesia Checklist: Patient identified, Emergency Drugs available, Suction available, Patient being monitored and Timeout performed Patient Re-evaluated:Patient Re-evaluated prior to induction Oxygen Delivery Method: Circle system utilized Preoxygenation: Pre-oxygenation with 100% oxygen Induction Type: IV induction Ventilation: Mask ventilation without difficulty LMA: LMA inserted LMA Size: 4.0 Number of attempts: 1 Placement Confirmation: breath sounds checked- equal and bilateral and positive ETCO2 Tube secured with: Tape Dental Injury: Teeth and Oropharynx as per pre-operative assessment  Comments: 2 lower teeth loose baseline prior to induction. Atraumatic placement. Dentition unchanged.

## 2024-12-07 NOTE — Op Note (Signed)
 Preoperative diagnosis: Right chest wall mass 8 cm x 6 cm subcutaneous  Postoperative diagnosis: 8 cm x 6 cm epidermal inclusion cyst  Procedure: Excision of right chest wall mass subcutaneous measuring 8 cm x 6 cm  Anesthesia: LMA with 0.25% Marcaine  with epinephrine   EBL: Minimal  Specimen: Right chest wall cyst subcutaneous to pathology  Drains: None  Indications for procedure: The patient is a 54 year old male with a longstanding history of a right chest wall cystic mass.  He underwent workup.  He was to have it excised due to increasing size and discomfort.  Imaging showed this to mostly be cystic with solid component.  We reviewed doing a preoperative biopsy as well but he did not wish to proceed in 1 to proceed directly to an excisional biopsy.The procedure has been discussed with the patient.  Alternative therapies have been discussed with the patient.  Operative risks include bleeding,  Infection,  Organ injury,  Nerve injury,  Blood vessel injury,  DVT,  Pulmonary embolism,  Death,  And possible reoperation.  Medical management risks include worsening of present situation.  The success of the procedure is 50 -90 % at treating patients symptoms.  The patient understands and agrees to proceed.    Description of procedure: The patient was met in the holding area questions were answered.  The right breast was marked as the correct site and the mass was easily visible.  He was then taken back to the operating room placed supine upon the OR table.  After induction of general anesthesia, the right breast was prepped and draped in a sterile fashion and timeout performed.  Local anesthetic was infiltrated around the mass.  Incision was made.  Upon doing this there was a large amount of sebum that evacuated from what appeared to be a large epidermal inclusion cyst.  There were no obvious signs of infection but the amount of sebum was significant.  Once it was evacuated I could excise the cyst and  overlying skin in its entirety to prevent recurrence.  This was quite large and took a significant amount of his right breast area.  Once I removed the cystic area we irrigated copiously.  0.25% Marcaine  was infiltrated throughout the wound.  We then approximated the skin edges with 3-0 Vicryl tacking these down to the pectoralis major muscle.  4 Monocryl was used to close the skin in a subcuticular fashion.  Dermabond was applied.  All counts were found to be correct.  The patient was then awoke extubated taken to recovery in satisfactory condition.

## 2024-12-09 ENCOUNTER — Ambulatory Visit: Admission: EM | Admit: 2024-12-09 | Discharge: 2024-12-09 | Disposition: A | Discharge status: Home / Self Care

## 2024-12-09 ENCOUNTER — Encounter: Payer: Self-pay | Admitting: Emergency Medicine

## 2024-12-09 DIAGNOSIS — L7631 Postprocedural hematoma of skin and subcutaneous tissue following a dermatologic procedure: Secondary | ICD-10-CM | POA: Diagnosis not present

## 2024-12-09 LAB — SURGICAL PATHOLOGY

## 2024-12-09 NOTE — ED Triage Notes (Signed)
 Pt reports having breast mass removed on R side on 12/08/23. Pt reports he was up walking ~52mins ago and felt the area pop and had consistent bleeding from site that has since stopped. Pt reports surgeon left the area open to air so he covered it with a towel while it was bleeding. Has not called surgeon line - surgery completed at [facility].

## 2024-12-09 NOTE — ED Provider Notes (Signed)
 " [facility]    CSN: [id number] Arrival date & time: 12/09/24  1458      History   Chief Complaint Chief Complaint  Patient presents with   Wound Check    HPI Timothy Estrada is a 54 y.o. male.   54 year old male who presents urgent care with complaints of bleeding from his right breast incision.  2 days ago he underwent excision of a large epidermal inclusion cyst from the right breast.  He reports that today he developed dark bloody oozing from the incision.  The oozing has slowed down significantly now.  He denies any fevers or chills.  He has been doing activity as directed with walking.  He denies any heavy lifting.   Wound Check Pertinent negatives include no chest pain, no abdominal pain and no shortness of breath.    Past Medical History:  Diagnosis Date   ETOH abuse    drinks a pint of vodka daily   Hypertension    Smoker    1/2 ppd    Patient Active Problem List   Diagnosis Date Noted   Alcohol  use disorder 09/09/2024   Fall 06/19/2024   Alcohol  use disorder, severe, dependence (HCC) 06/12/2023    Past Surgical History:  Procedure Laterality Date   FRACTURE SURGERY     Hx of right ankle   ORIF CLAVICULAR FRACTURE Right 04/29/2013   Procedure: OPEN REDUCTION INTERNAL FIXATION (ORIF) CLAVICULAR FRACTURE;  Surgeon: Ozell VEAR Bruch, MD;  Location: [facility] OR;  Service: Orthopedics;  Laterality: Right;  Aspiration of right breast mass,? sebaceaous cyst       Home Medications    Prior to Admission medications  Medication Sig Start Date End Date Taking? Authorizing Provider  acetaminophen  (TYLENOL ) 500 MG tablet Take 2 tablets (1,000 mg total) by mouth every 6 (six) hours as needed. Patient taking differently: Take 1,000-1,500 mg by mouth every 6 (six) hours as needed (For pain). 06/21/24  Yes Tammy Sor, PA-C  doxycycline  (VIBRA -TABS) 100 MG tablet Take 1 tablet (100 mg total) by mouth 2 (two) times daily for 7 days. 12/07/24 12/14/24 Yes Cornett,  Debby, MD  lisinopril  (ZESTRIL ) 20 MG tablet Take 1 tablet (20 mg total) by mouth daily. 10/12/24  Yes Leavy Lucas Fox, PA-C  oxyCODONE  (OXY IR/ROXICODONE ) 5 MG immediate release tablet Take 1 tablet (5 mg total) by mouth every 6 (six) hours as needed for severe pain (pain score 7-10). 12/07/24  Yes Cornett, Debby, MD  amLODipine  (NORVASC ) 5 MG tablet Take 1 tablet (5 mg total) by mouth daily. Patient not taking: Reported on 12/09/2024 10/12/24   Leavy Lucas Fox, PA-C  gabapentin  (NEURONTIN ) 100 MG capsule Take 1 capsule (100 mg total) by mouth 3 (three) times daily. 09/14/24   Olasunkanmi, Oluwatosin, NP    Family History History reviewed. No pertinent family history.  Social History Social History[1]   Allergies   Patient has no known allergies.   Review of Systems Review of Systems  Constitutional:  Negative for chills and fever.  HENT:  Negative for ear pain and sore throat.   Eyes:  Negative for pain and visual disturbance.  Respiratory:  Negative for cough and shortness of breath.   Cardiovascular:  Negative for chest pain and palpitations.  Gastrointestinal:  Negative for abdominal pain and vomiting.  Genitourinary:  Negative for dysuria and hematuria.  Musculoskeletal:  Negative for arthralgias and back pain.  Skin:  Negative for color change and rash.       Bloody  oozing from postoperative incision on right breast  Neurological:  Negative for seizures and syncope.  All other systems reviewed and are negative.    Physical Exam Triage Vital Signs ED Triage Vitals  Encounter Vitals Group     BP 12/09/24 1532 123/86     Girls Systolic BP Percentile --      Girls Diastolic BP Percentile --      Boys Systolic BP Percentile --      Boys Diastolic BP Percentile --      Pulse Rate 12/09/24 1532 83     Resp 12/09/24 1532 18     Temp 12/09/24 1532 (!) 97.2 F (36.2 C)     Temp Source 12/09/24 1532 Oral     SpO2 12/09/24 1532 95 %     Weight --      Height --       Head Circumference --      Peak Flow --      Pain Score 12/09/24 1533 8     Pain Loc --      Pain Education --      Exclude from Growth Chart --    No data found.  Updated Vital Signs BP 123/86 (BP Location: Left Arm)   Pulse 83   Temp (!) 97.2 F (36.2 C) (Oral)   Resp 18   SpO2 95%   Visual Acuity Right Eye Distance:   Left Eye Distance:   Bilateral Distance:    Right Eye Near:   Left Eye Near:    Bilateral Near:     Physical Exam Vitals and nursing note reviewed.  Constitutional:      General: He is not in acute distress.    Appearance: He is well-developed.  HENT:     Head: Normocephalic and atraumatic.  Eyes:     Conjunctiva/sclera: Conjunctivae normal.  Cardiovascular:     Rate and Rhythm: Normal rate.  Pulmonary:     Effort: Pulmonary effort is normal. No respiratory distress.  Abdominal:     Palpations: Abdomen is soft.     Tenderness: There is no abdominal tenderness.  Musculoskeletal:        General: No swelling.     Cervical back: Neck supple.  Skin:    General: Skin is warm and dry.     Capillary Refill: Capillary refill takes less than 2 seconds.      Neurological:     Mental Status: He is alert.  Psychiatric:        Mood and Affect: Mood normal.      UC Treatments / Results  Labs (all labs ordered are listed, but only abnormal results are displayed) Labs Reviewed - No data to display  EKG   Radiology No results found.  Procedures Procedures (including critical care time)  Medications Ordered in UC Medications - No data to display  Initial Impression / Assessment and Plan / UC Course  I have reviewed the triage vital signs and the nursing notes.  Pertinent labs & imaging results that were available during my care of the patient were reviewed by me and considered in my medical decision making (see chart for details).     Postoperative hematoma of subcutaneous tissue following dermatologic procedure   Symptoms and  physical exam findings are most consistent with a postoperative hematoma of the right breast after excision of epidermal inclusion cyst.  Do not believe that there is active bleeding however there is a large amount of blood in the space  that has begun to drain through the incision.  Recommend keeping a dry dressing over the area and change this 2-3 times daily as needed.  Recommend contacting the surgeons office as soon as possible to schedule an appointment to be seen within the next 2 to 3 days if possible.  Continue medication as prescribed.  Can follow-up at urgent care if needed.  Final Clinical Impressions(s) / UC Diagnoses   Final diagnoses:  Postoperative hematoma of subcutaneous tissue following dermatologic procedure     Discharge Instructions      Symptoms and physical exam findings are most consistent with a postoperative hematoma of the right breast after excision of epidermal inclusion cyst.  Do not believe that there is active bleeding however there is a large amount of blood in the space that has begun to drain through the incision.  Recommend keeping a dry dressing over the area and change this 2-3 times daily as needed.  Recommend contacting the surgeons office as soon as possible to schedule an appointment to be seen within the next 2 to 3 days if possible.  Continue medication as prescribed.  Can follow-up at urgent care if needed.    ED Prescriptions   None    PDMP not reviewed this encounter.    [1]  Social History Tobacco Use   Smoking status: Every Day    Current packs/day: 0.50    Types: Cigarettes  Vaping Use   Vaping status: Never Used  Substance Use Topics   Alcohol  use: Yes    Comment: daily vodka 1pint/day   Drug use: Yes    Types: Marijuana    Comment: last smoked 12-06-24     Teresa Almarie LABOR, [place] 12/09/24 1544  "

## 2024-12-09 NOTE — Discharge Instructions (Addendum)
 Symptoms and physical exam findings are most consistent with a postoperative hematoma of the right breast after excision of epidermal inclusion cyst.  Do not believe that there is active bleeding however there is a large amount of blood in the space that has begun to drain through the incision.  Recommend keeping a dry dressing over the area and change this 2-3 times daily as needed.  Recommend contacting the surgeons office as soon as possible to schedule an appointment to be seen within the next 2 to 3 days if possible.  Continue medication as prescribed.  Can follow-up at urgent care if needed.
